# Patient Record
Sex: Female | Born: 1980 | Race: White | Hispanic: No | Marital: Single | Smoking: Current every day smoker
Health system: Southern US, Community
[De-identification: ages and names within clinical notes are randomized; demographics above are authoritative.]

## PROBLEM LIST (undated history)

## (undated) HISTORY — PX: TUBAL LIGATION: SHX77

---

## 2001-09-25 ENCOUNTER — Encounter: Admission: RE | Admit: 2001-09-25 | Discharge: 2001-09-25 | Payer: Self-pay | Admitting: Family Medicine

## 2001-10-14 ENCOUNTER — Other Ambulatory Visit: Admission: RE | Admit: 2001-10-14 | Discharge: 2001-10-14 | Payer: Self-pay | Admitting: Family Medicine

## 2001-10-14 ENCOUNTER — Encounter: Admission: RE | Admit: 2001-10-14 | Discharge: 2001-10-14 | Payer: Self-pay | Admitting: Family Medicine

## 2001-10-20 ENCOUNTER — Ambulatory Visit (HOSPITAL_COMMUNITY): Admission: RE | Admit: 2001-10-20 | Discharge: 2001-10-20 | Payer: Self-pay | Admitting: Family Medicine

## 2001-11-19 ENCOUNTER — Encounter: Admission: RE | Admit: 2001-11-19 | Discharge: 2001-11-19 | Payer: Self-pay | Admitting: Family Medicine

## 2001-11-24 ENCOUNTER — Ambulatory Visit (HOSPITAL_COMMUNITY): Admission: RE | Admit: 2001-11-24 | Discharge: 2001-11-24 | Payer: Self-pay

## 2001-12-21 ENCOUNTER — Encounter: Admission: RE | Admit: 2001-12-21 | Discharge: 2001-12-21 | Payer: Self-pay | Admitting: Family Medicine

## 2001-12-24 ENCOUNTER — Encounter: Admission: RE | Admit: 2001-12-24 | Discharge: 2001-12-24 | Payer: Self-pay | Admitting: Family Medicine

## 2002-01-27 ENCOUNTER — Encounter: Admission: RE | Admit: 2002-01-27 | Discharge: 2002-01-27 | Payer: Self-pay | Admitting: Family Medicine

## 2002-02-02 ENCOUNTER — Encounter: Admission: RE | Admit: 2002-02-02 | Discharge: 2002-02-02 | Payer: Self-pay | Admitting: Sports Medicine

## 2002-02-04 ENCOUNTER — Inpatient Hospital Stay (HOSPITAL_COMMUNITY): Admission: RE | Admit: 2002-02-04 | Discharge: 2002-02-04 | Payer: Self-pay | Admitting: *Deleted

## 2002-02-08 ENCOUNTER — Encounter (HOSPITAL_COMMUNITY): Admission: AD | Admit: 2002-02-08 | Discharge: 2002-03-10 | Payer: Self-pay | Admitting: *Deleted

## 2002-02-11 ENCOUNTER — Encounter: Admission: RE | Admit: 2002-02-11 | Discharge: 2002-02-11 | Payer: Self-pay | Admitting: Family Medicine

## 2002-02-19 ENCOUNTER — Ambulatory Visit (HOSPITAL_COMMUNITY): Admission: RE | Admit: 2002-02-19 | Discharge: 2002-02-19 | Payer: Self-pay | Admitting: *Deleted

## 2002-02-26 ENCOUNTER — Ambulatory Visit (HOSPITAL_COMMUNITY): Admission: RE | Admit: 2002-02-26 | Discharge: 2002-02-26 | Payer: Self-pay | Admitting: *Deleted

## 2002-02-26 ENCOUNTER — Encounter: Admission: RE | Admit: 2002-02-26 | Discharge: 2002-02-26 | Payer: Self-pay | Admitting: Family Medicine

## 2002-03-02 ENCOUNTER — Encounter: Payer: Self-pay | Admitting: *Deleted

## 2002-03-11 ENCOUNTER — Encounter (HOSPITAL_COMMUNITY): Admission: RE | Admit: 2002-03-11 | Discharge: 2002-03-11 | Payer: Self-pay | Admitting: *Deleted

## 2002-03-12 ENCOUNTER — Encounter: Admission: RE | Admit: 2002-03-12 | Discharge: 2002-03-12 | Payer: Self-pay | Admitting: Family Medicine

## 2002-03-15 ENCOUNTER — Encounter (HOSPITAL_COMMUNITY): Admission: RE | Admit: 2002-03-15 | Discharge: 2002-03-26 | Payer: Self-pay | Admitting: Obstetrics and Gynecology

## 2002-03-18 ENCOUNTER — Encounter: Admission: RE | Admit: 2002-03-18 | Discharge: 2002-03-18 | Payer: Self-pay | Admitting: Family Medicine

## 2002-03-23 ENCOUNTER — Inpatient Hospital Stay (HOSPITAL_COMMUNITY): Admission: AD | Admit: 2002-03-23 | Discharge: 2002-03-23 | Payer: Self-pay | Admitting: *Deleted

## 2002-03-26 ENCOUNTER — Encounter: Payer: Self-pay | Admitting: Obstetrics and Gynecology

## 2002-03-26 ENCOUNTER — Inpatient Hospital Stay (HOSPITAL_COMMUNITY): Admission: AD | Admit: 2002-03-26 | Discharge: 2002-03-30 | Payer: Self-pay | Admitting: Family Medicine

## 2002-03-28 ENCOUNTER — Encounter (INDEPENDENT_AMBULATORY_CARE_PROVIDER_SITE_OTHER): Payer: Self-pay | Admitting: Specialist

## 2002-05-13 ENCOUNTER — Encounter: Admission: RE | Admit: 2002-05-13 | Discharge: 2002-05-13 | Payer: Self-pay | Admitting: Family Medicine

## 2002-06-09 ENCOUNTER — Encounter: Admission: RE | Admit: 2002-06-09 | Discharge: 2002-06-09 | Payer: Self-pay | Admitting: Sports Medicine

## 2002-06-16 ENCOUNTER — Encounter: Admission: RE | Admit: 2002-06-16 | Discharge: 2002-06-16 | Payer: Self-pay | Admitting: Family Medicine

## 2003-07-15 ENCOUNTER — Other Ambulatory Visit: Admission: RE | Admit: 2003-07-15 | Discharge: 2003-07-15 | Payer: Self-pay | Admitting: Family Medicine

## 2003-07-15 ENCOUNTER — Encounter: Admission: RE | Admit: 2003-07-15 | Discharge: 2003-07-15 | Payer: Self-pay | Admitting: Sports Medicine

## 2005-01-25 ENCOUNTER — Encounter (INDEPENDENT_AMBULATORY_CARE_PROVIDER_SITE_OTHER): Payer: Self-pay | Admitting: *Deleted

## 2005-02-21 ENCOUNTER — Ambulatory Visit: Payer: Self-pay | Admitting: Sports Medicine

## 2005-02-21 ENCOUNTER — Other Ambulatory Visit: Admission: RE | Admit: 2005-02-21 | Discharge: 2005-02-21 | Payer: Self-pay | Admitting: Family Medicine

## 2005-03-21 ENCOUNTER — Ambulatory Visit: Payer: Self-pay | Admitting: Sports Medicine

## 2006-04-24 DIAGNOSIS — L2089 Other atopic dermatitis: Secondary | ICD-10-CM

## 2006-04-25 ENCOUNTER — Encounter (INDEPENDENT_AMBULATORY_CARE_PROVIDER_SITE_OTHER): Payer: Self-pay | Admitting: *Deleted

## 2007-04-09 ENCOUNTER — Encounter: Payer: Self-pay | Admitting: *Deleted

## 2007-05-13 ENCOUNTER — Ambulatory Visit: Payer: Self-pay | Admitting: Family Medicine

## 2007-05-13 DIAGNOSIS — F329 Major depressive disorder, single episode, unspecified: Secondary | ICD-10-CM | POA: Insufficient documentation

## 2007-06-17 ENCOUNTER — Encounter (INDEPENDENT_AMBULATORY_CARE_PROVIDER_SITE_OTHER): Payer: Self-pay | Admitting: *Deleted

## 2008-04-27 ENCOUNTER — Ambulatory Visit: Payer: Self-pay | Admitting: Family Medicine

## 2008-08-04 ENCOUNTER — Ambulatory Visit: Payer: Self-pay | Admitting: Family Medicine

## 2008-08-04 ENCOUNTER — Encounter: Payer: Self-pay | Admitting: Family Medicine

## 2008-08-04 LAB — CONVERTED CEMR LAB
Basophils Absolute: 0 10*3/uL (ref 0.0–0.1)
Bilirubin Urine: NEGATIVE
Hemoglobin: 11.2 g/dL — ABNORMAL LOW (ref 12.0–15.0)
Hepatitis B Surface Ag: NEGATIVE
Ketones, urine, test strip: NEGATIVE
Lymphocytes Relative: 36 % (ref 12–46)
Lymphs Abs: 2.6 10*3/uL (ref 0.7–4.0)
Monocytes Absolute: 0.4 10*3/uL (ref 0.1–1.0)
Monocytes Relative: 6 % (ref 3–12)
Neutro Abs: 4.1 10*3/uL (ref 1.7–7.7)
Protein, U semiquant: NEGATIVE
RBC: 4.01 M/uL (ref 3.87–5.11)
Rh Type: POSITIVE
Urobilinogen, UA: 0.2
WBC: 7.2 10*3/uL (ref 4.0–10.5)

## 2008-08-05 ENCOUNTER — Encounter: Payer: Self-pay | Admitting: Family Medicine

## 2008-08-11 ENCOUNTER — Encounter: Payer: Self-pay | Admitting: Family Medicine

## 2008-08-11 ENCOUNTER — Ambulatory Visit: Payer: Self-pay | Admitting: Family Medicine

## 2008-08-11 ENCOUNTER — Other Ambulatory Visit: Admission: RE | Admit: 2008-08-11 | Discharge: 2008-08-11 | Payer: Self-pay | Admitting: *Deleted

## 2008-08-11 LAB — CONVERTED CEMR LAB

## 2008-08-12 ENCOUNTER — Encounter: Payer: Self-pay | Admitting: *Deleted

## 2008-08-16 ENCOUNTER — Encounter: Payer: Self-pay | Admitting: Family Medicine

## 2008-08-17 ENCOUNTER — Encounter: Payer: Self-pay | Admitting: Family Medicine

## 2008-08-17 ENCOUNTER — Ambulatory Visit (HOSPITAL_COMMUNITY): Admission: RE | Admit: 2008-08-17 | Discharge: 2008-08-17 | Payer: Self-pay | Admitting: *Deleted

## 2008-08-17 ENCOUNTER — Ambulatory Visit: Payer: Self-pay | Admitting: Family Medicine

## 2008-08-17 IMAGING — US US OB COMP +14 WK
1 series · 14 of 28 positions shown · non-contrast
Comparison: none

OBSTETRICAL ULTRASOUND:
 This ultrasound exam was performed in the [HOSPITAL] Ultrasound Department.  The OB US report was generated in the AS system, and faxed to the ordering physician.  This report is also available in [REDACTED] PACS.

[Series 1: us ob comp less 14 wks · 0.18mm/px · 14 of 33 slices shown]
[im 2/33]
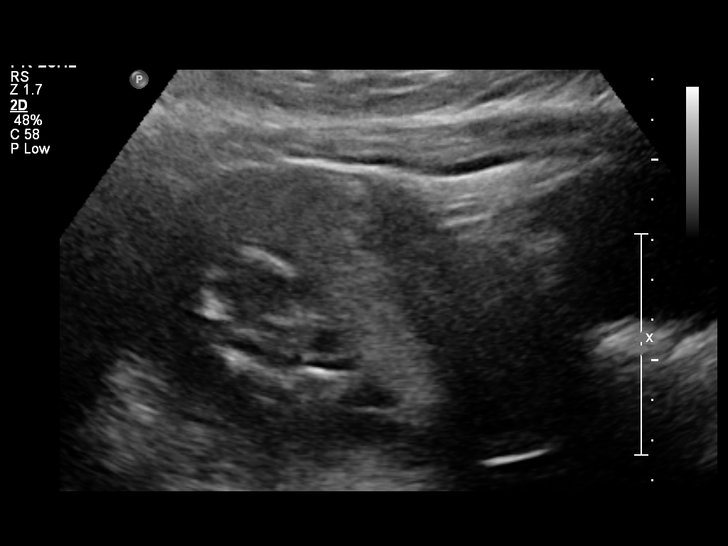
[im 4/33]
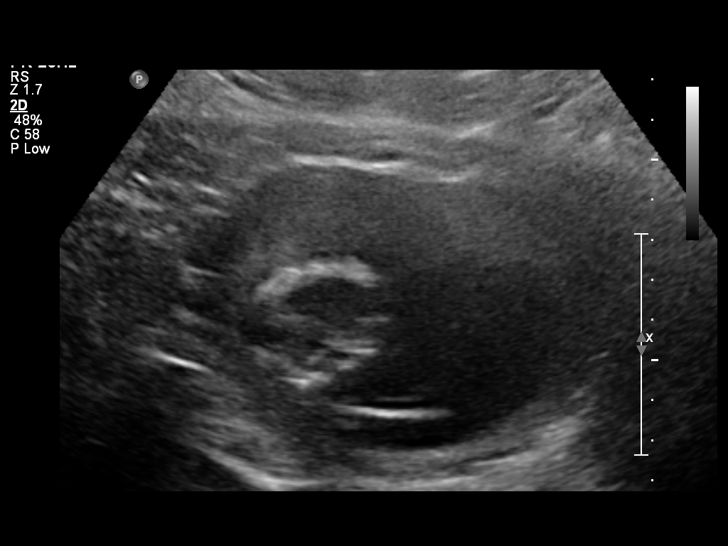
[im 6/33]
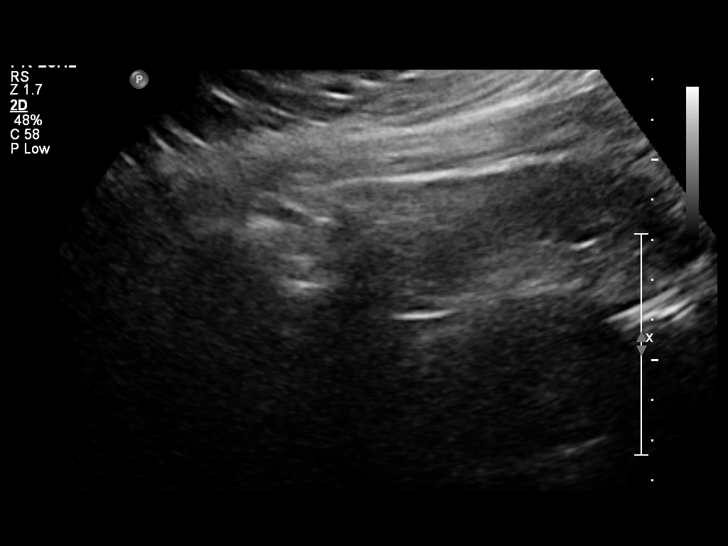
[im 9/33]
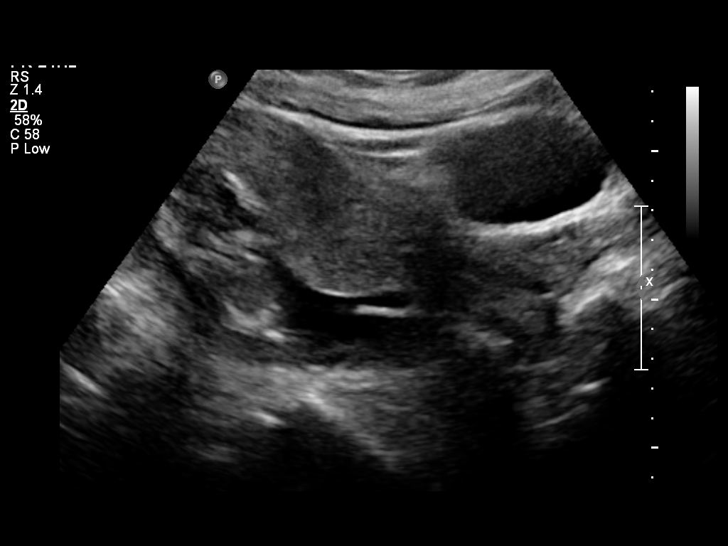
[im 11/33]
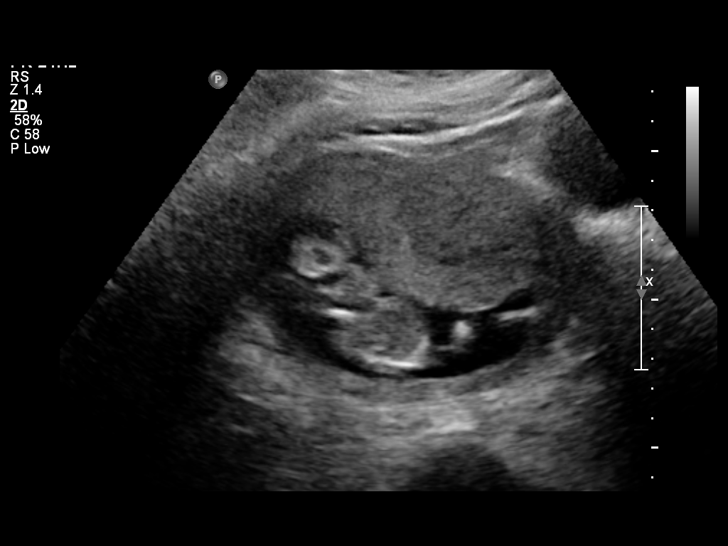
[im 14/33]
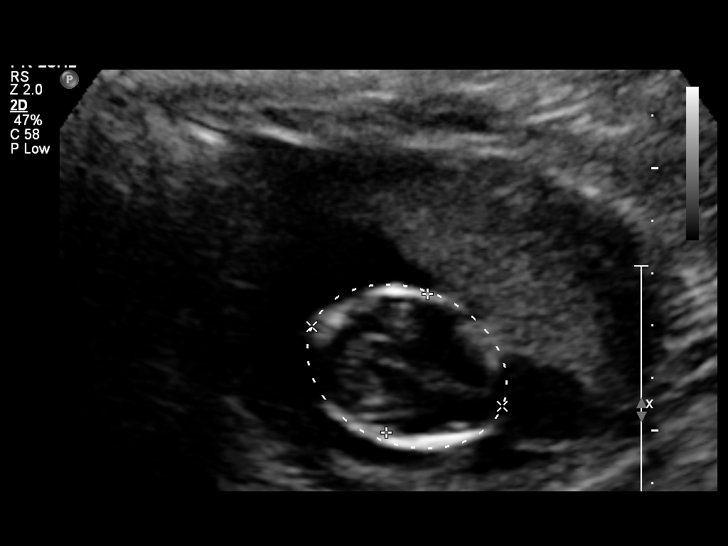
[im 16/33]
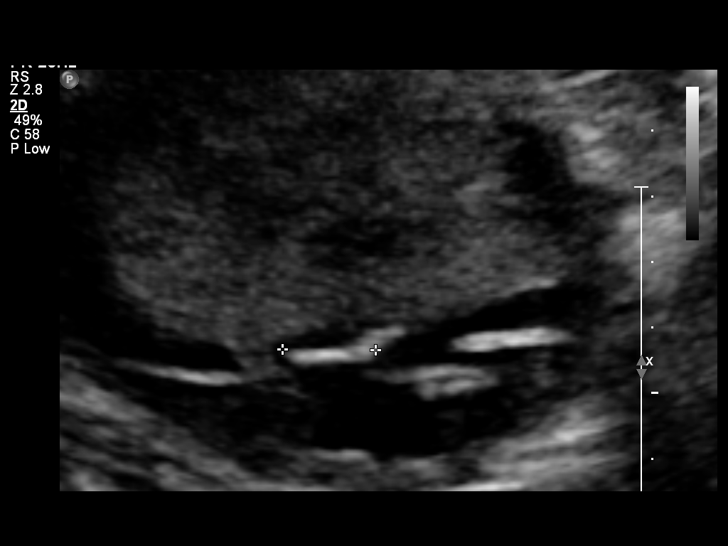
[im 18/33]
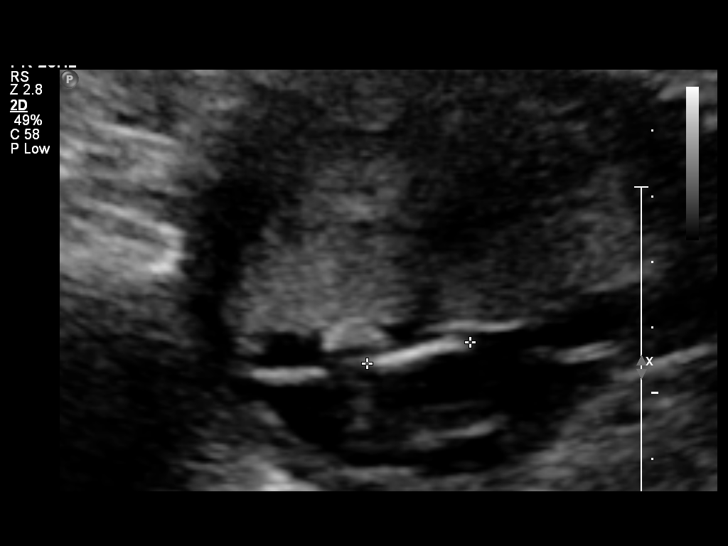
[im 21/33]
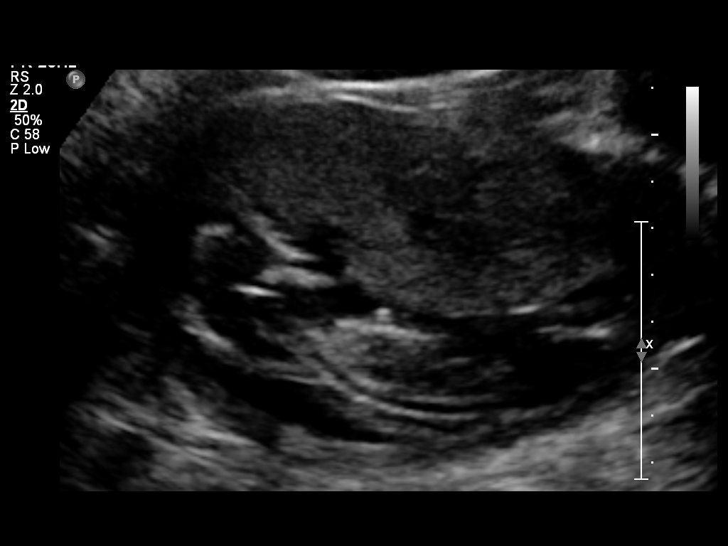
[im 23/33]
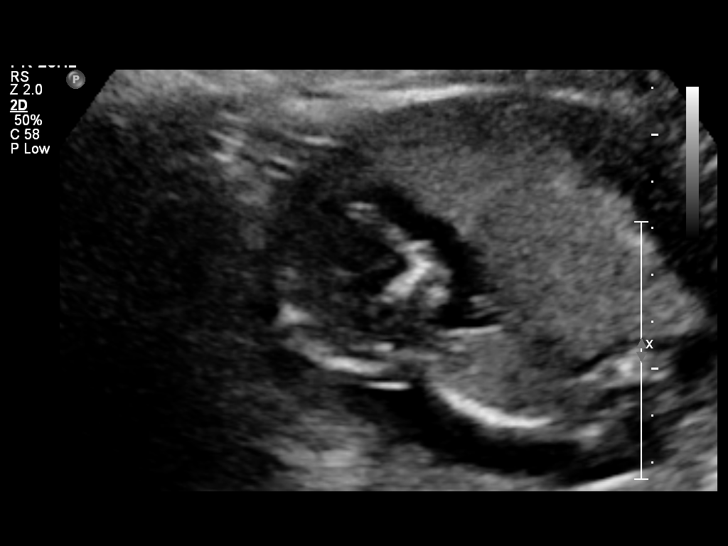
[im 25/33]
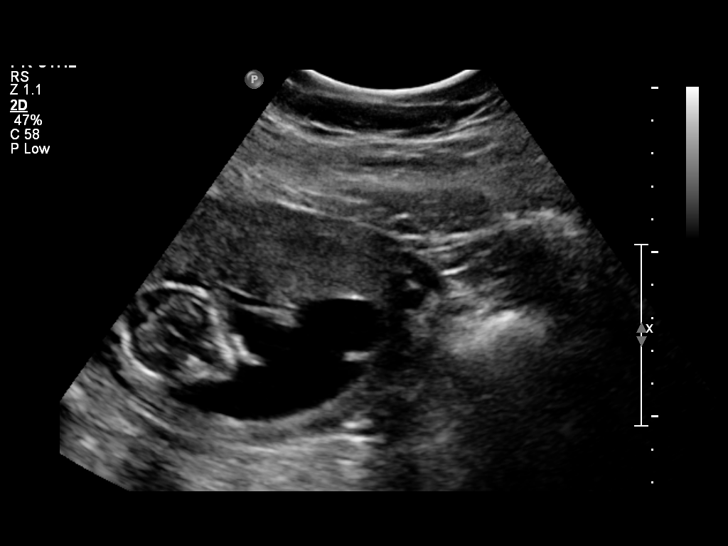
[im 28/33]
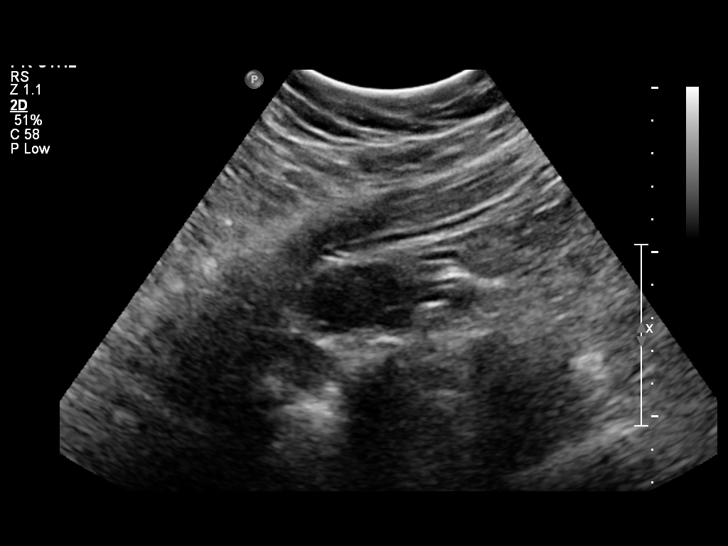
[im 30/33]
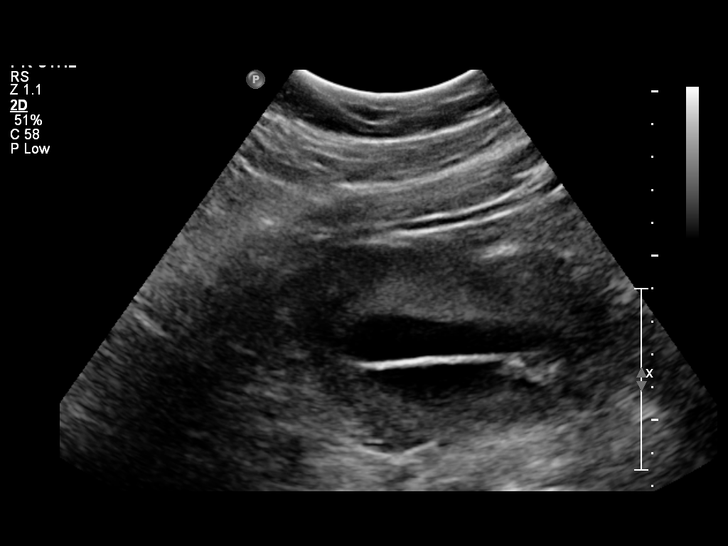
[im 33/33]
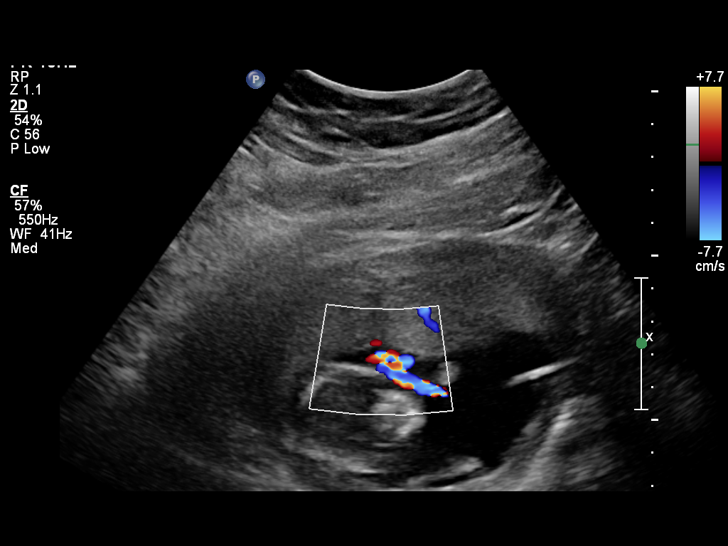

[14 of 28 positions shown; findings below may reference images not displayed]

IMPRESSION: See AS Obstetric US report.

## 2008-08-30 ENCOUNTER — Telehealth: Payer: Self-pay | Admitting: Family Medicine

## 2008-08-31 ENCOUNTER — Ambulatory Visit: Payer: Self-pay | Admitting: Family Medicine

## 2008-08-31 LAB — CONVERTED CEMR LAB: Rapid Strep: NEGATIVE

## 2008-09-07 ENCOUNTER — Ambulatory Visit: Payer: Self-pay | Admitting: Family Medicine

## 2008-09-12 ENCOUNTER — Ambulatory Visit (HOSPITAL_COMMUNITY): Admission: RE | Admit: 2008-09-12 | Discharge: 2008-09-12 | Payer: Self-pay | Admitting: *Deleted

## 2008-09-12 ENCOUNTER — Encounter: Payer: Self-pay | Admitting: Family Medicine

## 2008-09-12 IMAGING — US US OB DETAIL+14 WK
1 series · 14 of 28 positions shown · non-contrast
Comparison: none

OBSTETRICAL ULTRASOUND:
 This ultrasound exam was performed in the [HOSPITAL] Ultrasound Department.  The OB US report was generated in the AS system, and faxed to the ordering physician.  This report is also available in [REDACTED] PACS.

[Series 1: us ob detail +14 wk · 0.30mm/px · 14 of 50 slices shown]
[im 2/50]
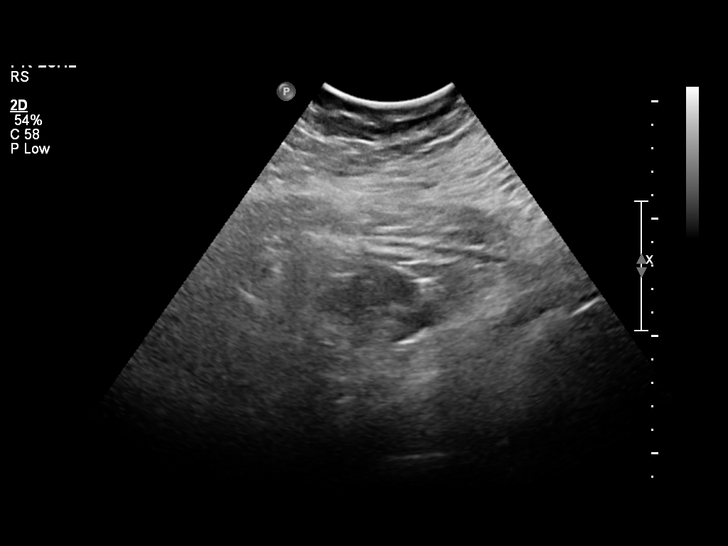
[im 6/50]
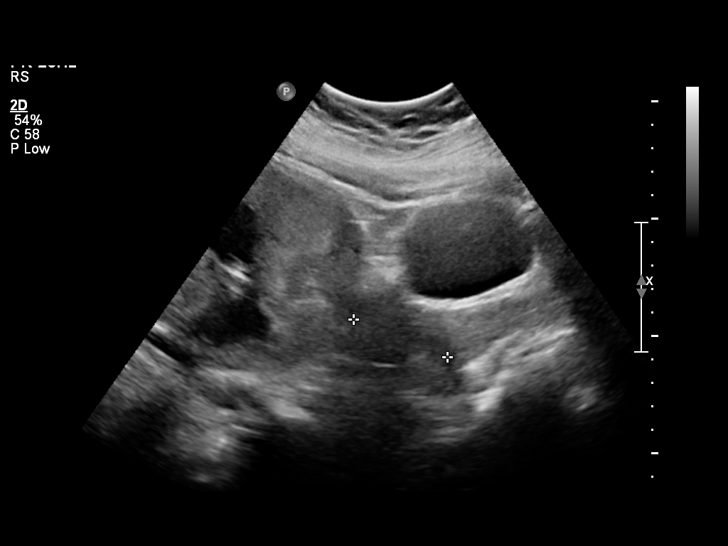
[im 10/50]
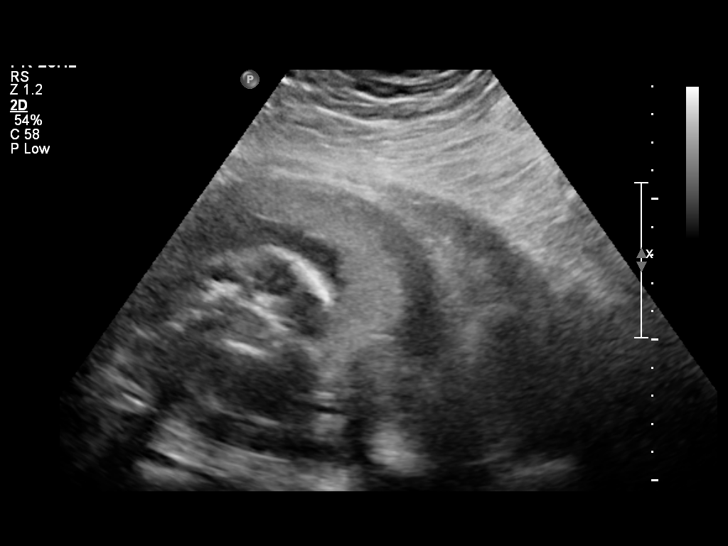
[im 13/50]
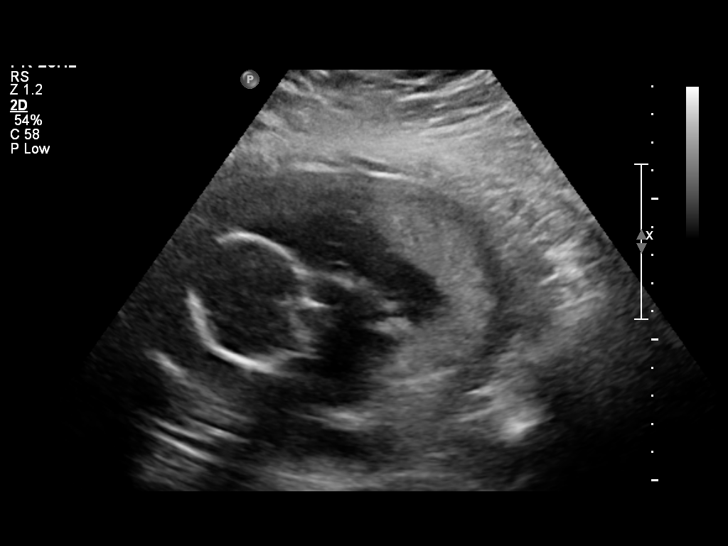
[im 17/50]
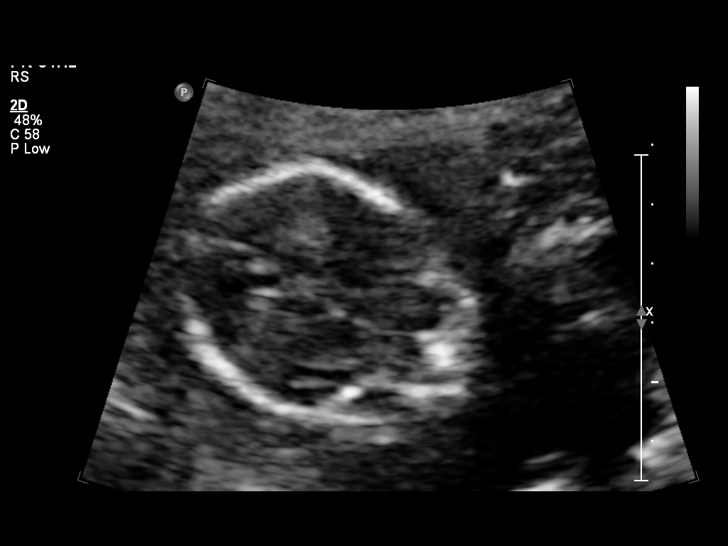
[im 20/50]
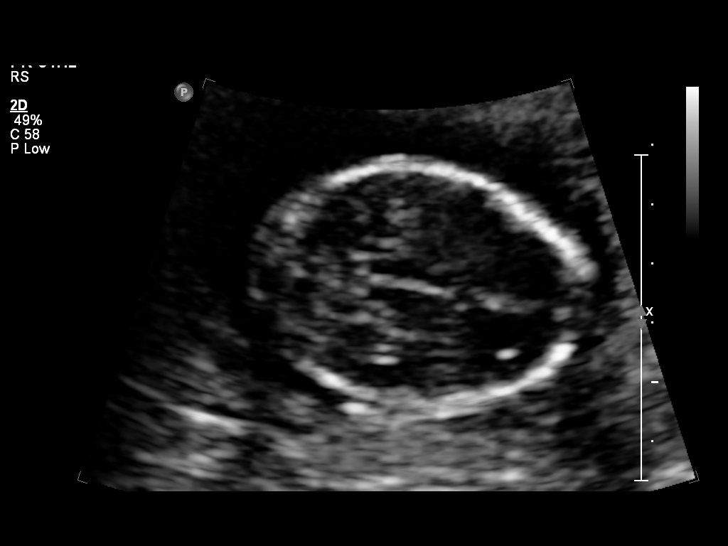
[im 24/50]
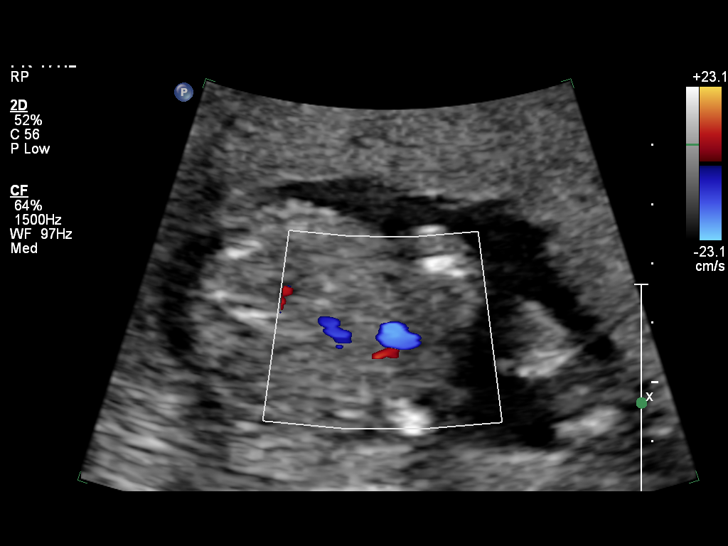
[im 28/50]
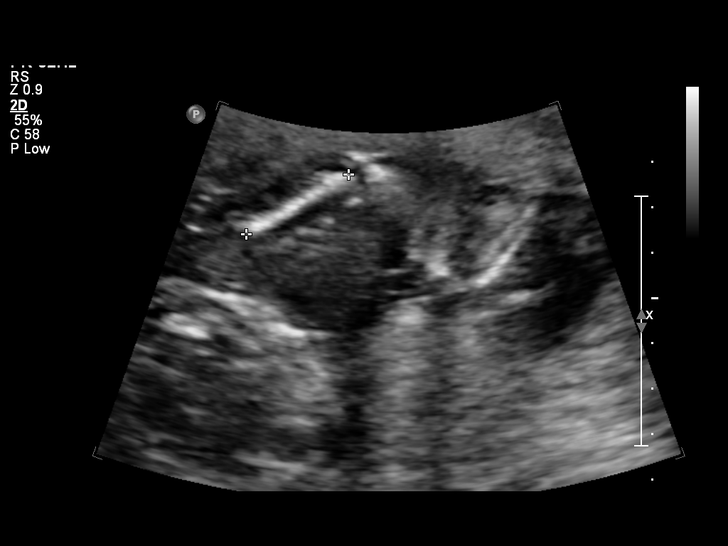
[im 31/50]
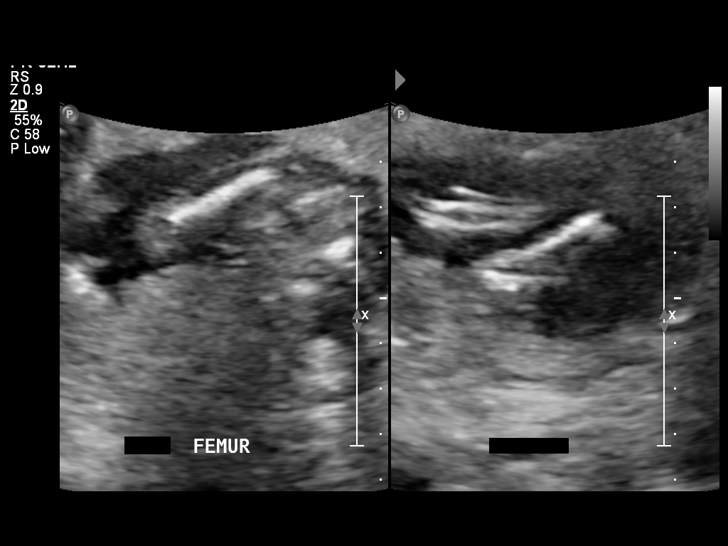
[im 35/50]
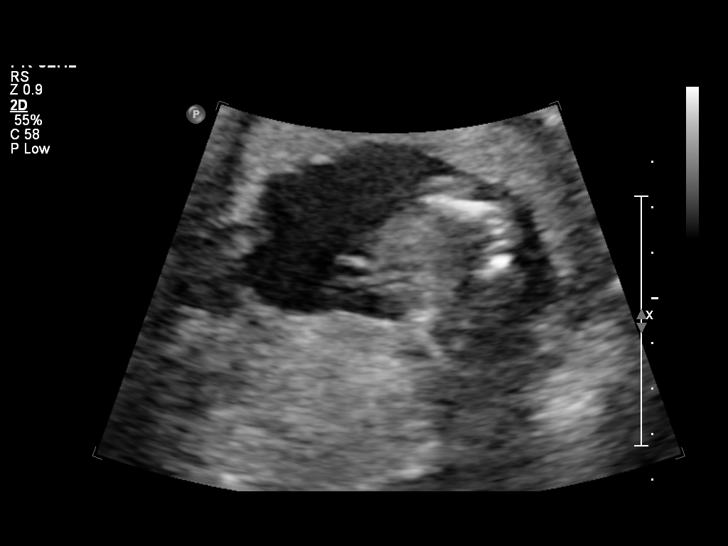
[im 39/50]
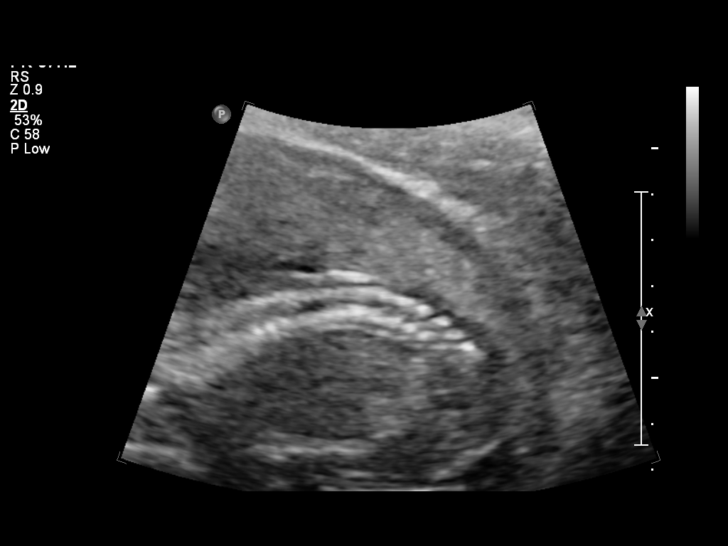
[im 42/50]
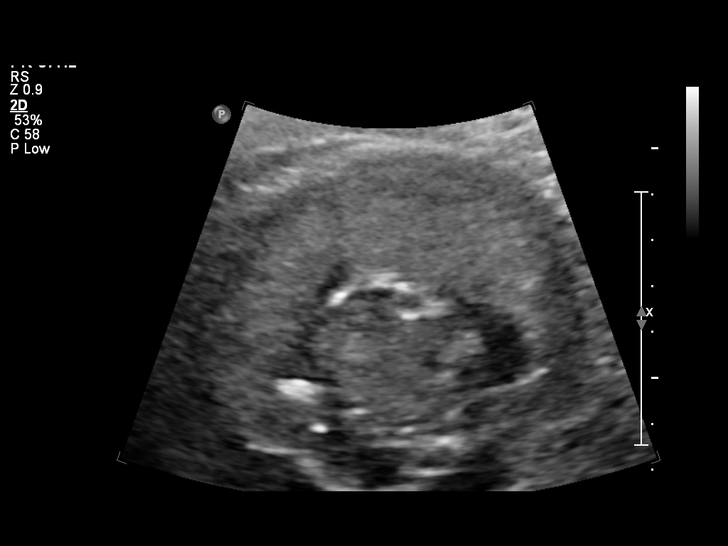
[im 46/50]
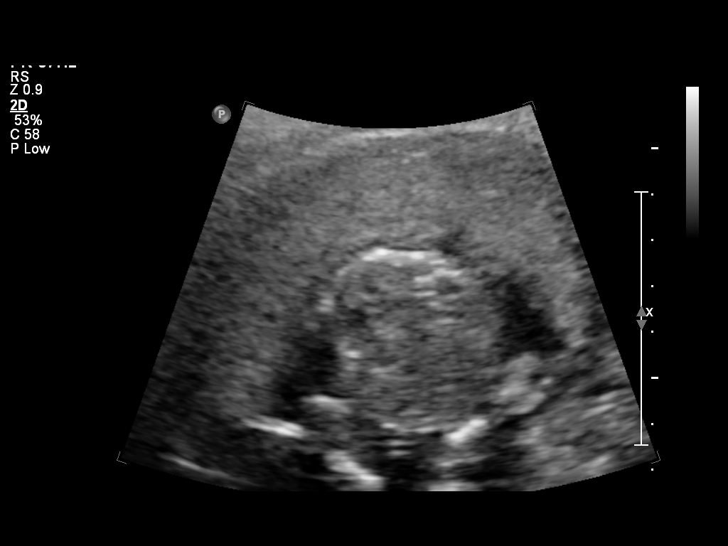
[im 50/50]
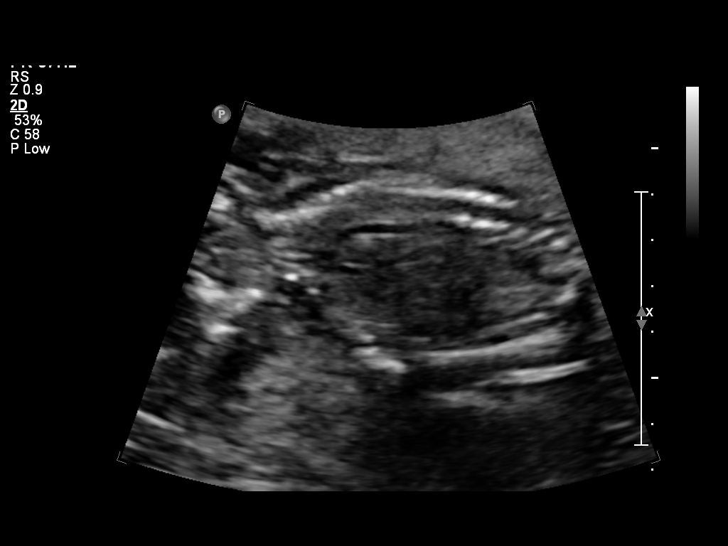

[14 of 28 positions shown; findings below may reference images not displayed]

IMPRESSION: See AS Obstetric US report.

## 2008-09-14 ENCOUNTER — Ambulatory Visit: Payer: Self-pay | Admitting: Family Medicine

## 2008-09-14 ENCOUNTER — Telehealth: Payer: Self-pay | Admitting: Family Medicine

## 2008-09-15 ENCOUNTER — Encounter: Payer: Self-pay | Admitting: Family Medicine

## 2008-09-15 ENCOUNTER — Encounter: Payer: Self-pay | Admitting: *Deleted

## 2008-09-22 ENCOUNTER — Telehealth (INDEPENDENT_AMBULATORY_CARE_PROVIDER_SITE_OTHER): Payer: Self-pay | Admitting: *Deleted

## 2008-10-05 ENCOUNTER — Ambulatory Visit: Payer: Self-pay | Admitting: Family Medicine

## 2008-11-04 ENCOUNTER — Encounter: Payer: Self-pay | Admitting: Family Medicine

## 2008-11-04 ENCOUNTER — Ambulatory Visit: Payer: Self-pay | Admitting: Family Medicine

## 2008-11-04 ENCOUNTER — Ambulatory Visit (HOSPITAL_COMMUNITY): Admission: RE | Admit: 2008-11-04 | Discharge: 2008-11-04 | Payer: Self-pay | Admitting: *Deleted

## 2008-11-04 IMAGING — US US OB FOLLOW-UP
1 series · 14 of 28 positions shown · non-contrast
Comparison: none

OBSTETRICAL ULTRASOUND:
 This ultrasound exam was performed in the [HOSPITAL] Ultrasound Department.  The OB US report was generated in the AS system, and faxed to the ordering physician.  This report is also available in [REDACTED] PACS.

[Series 1: us ob follow up · 0.24mm/px · 14 of 45 slices shown]
[im 2/45]
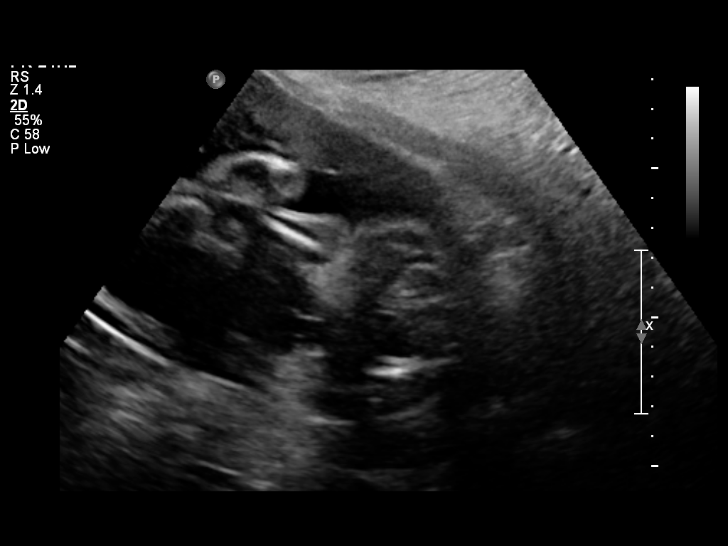
[im 5/45]
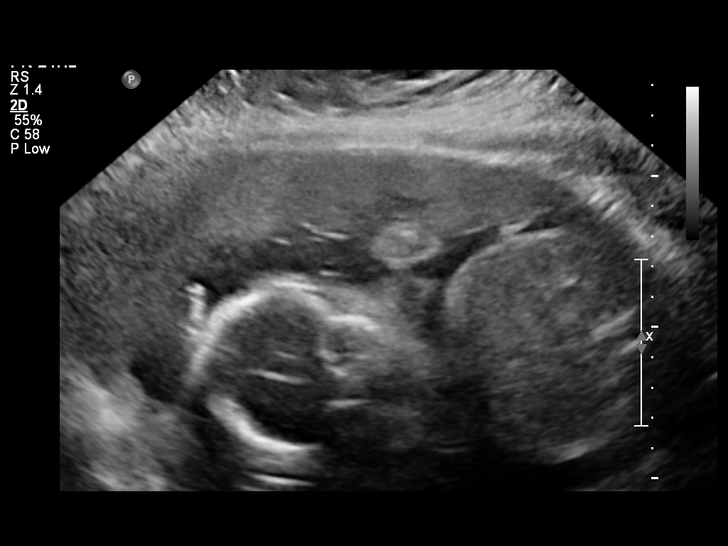
[im 9/45]
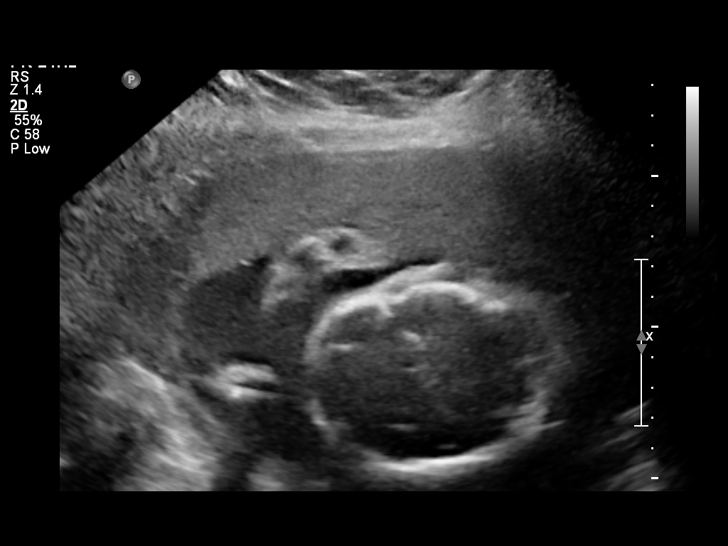
[im 12/45]
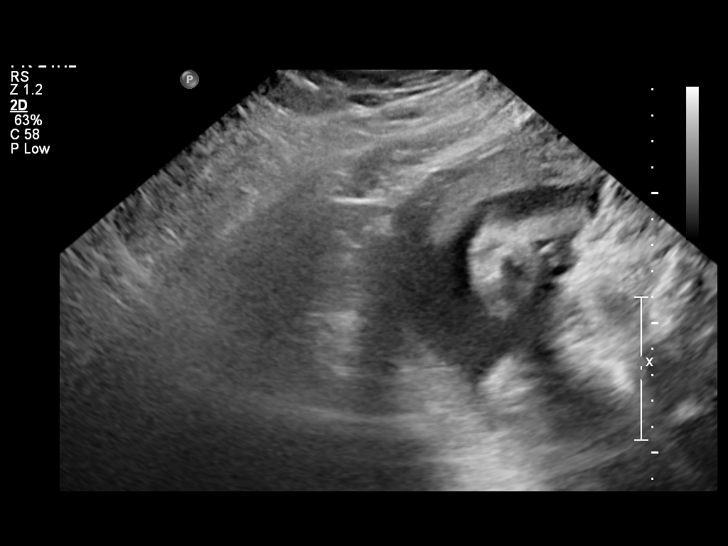
[im 15/45]
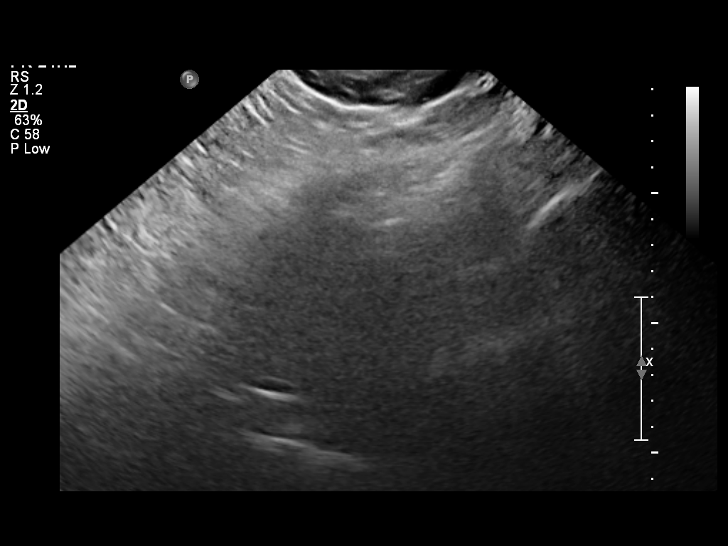
[im 18/45]
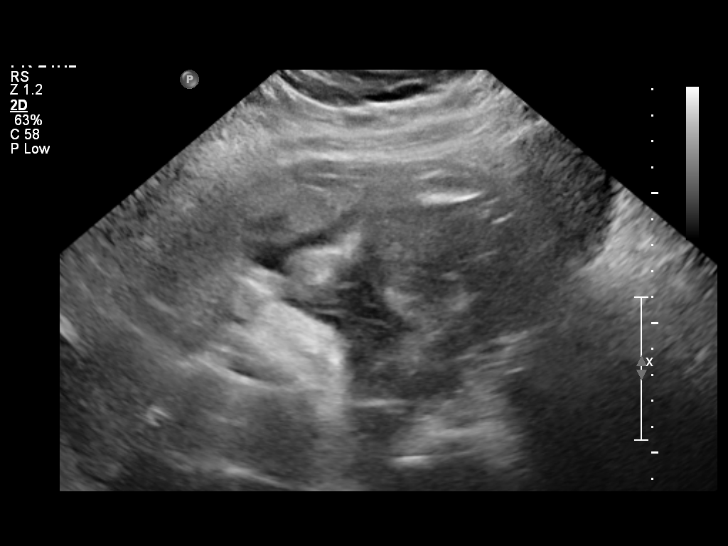
[im 22/45]
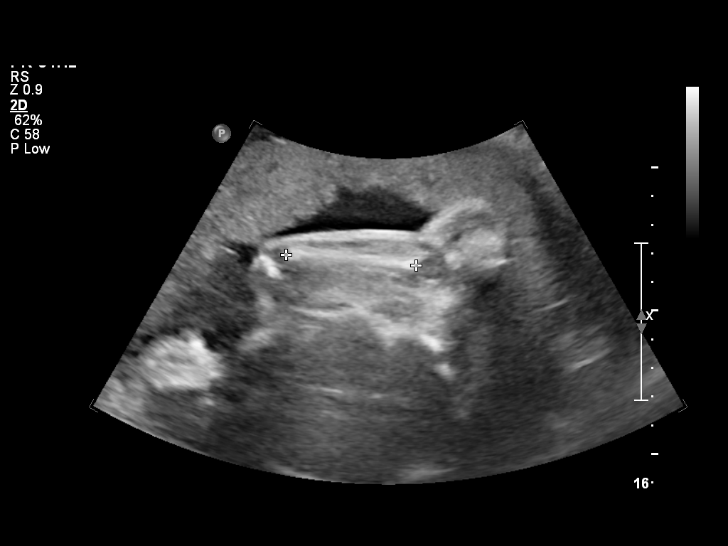
[im 25/45]
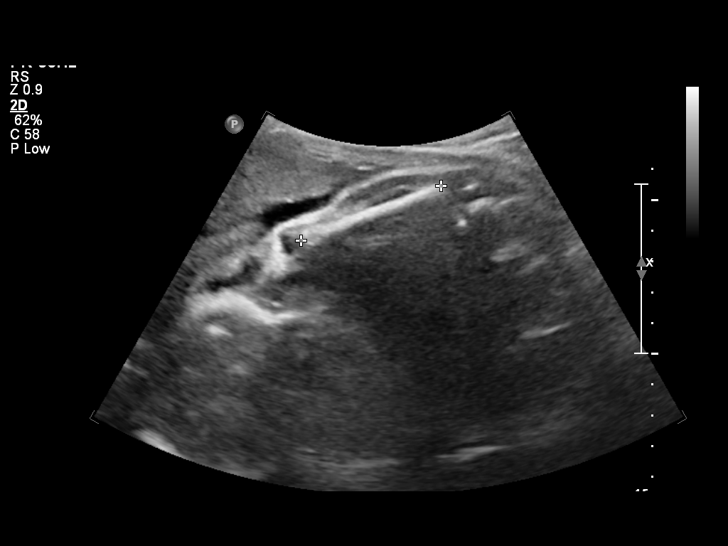
[im 28/45]
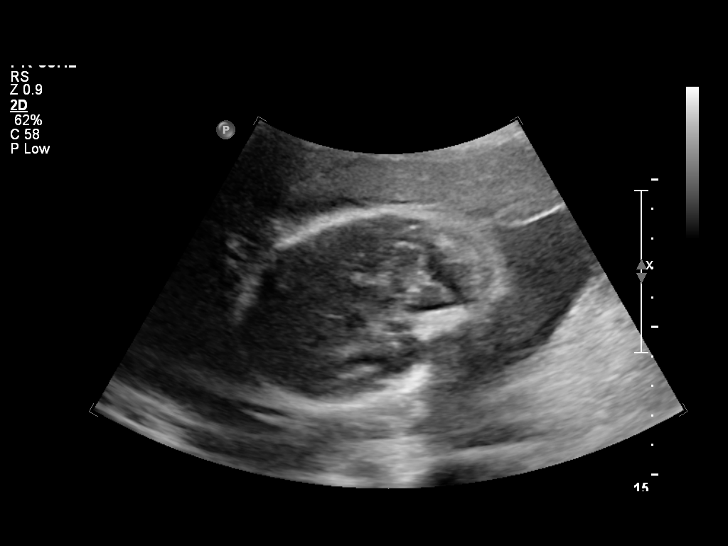
[im 31/45]
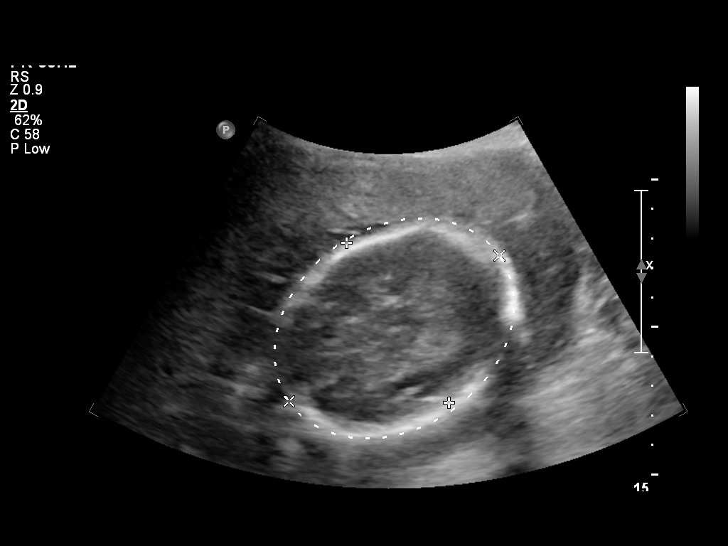
[im 35/45]
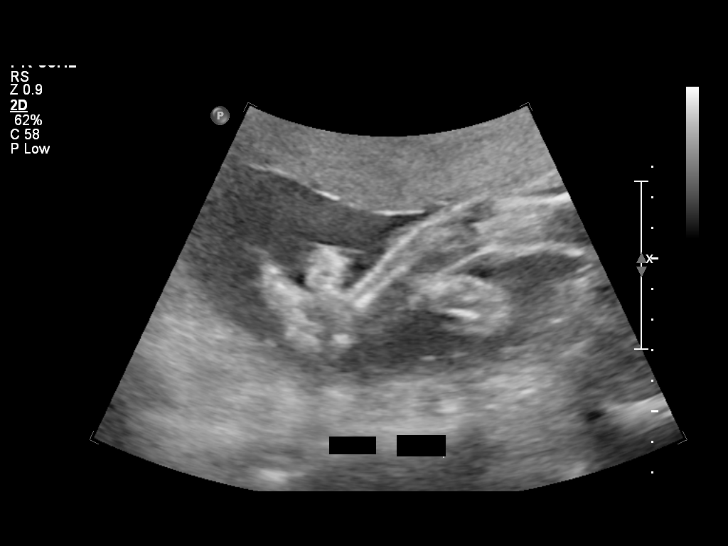
[im 38/45]
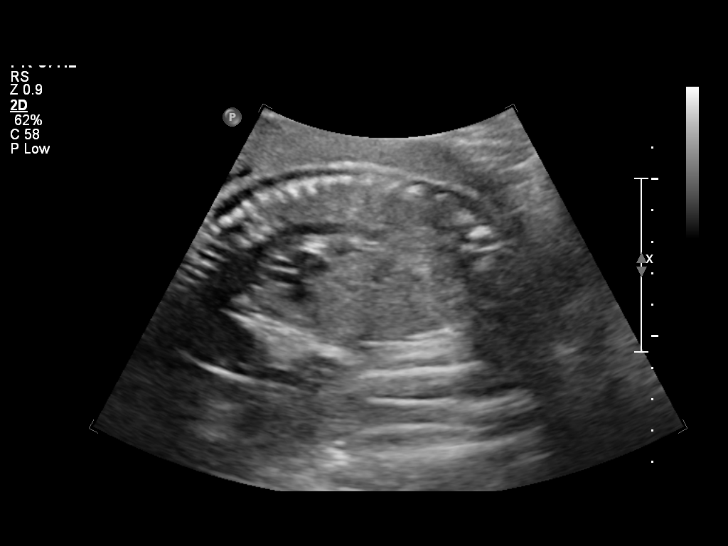
[im 41/45]
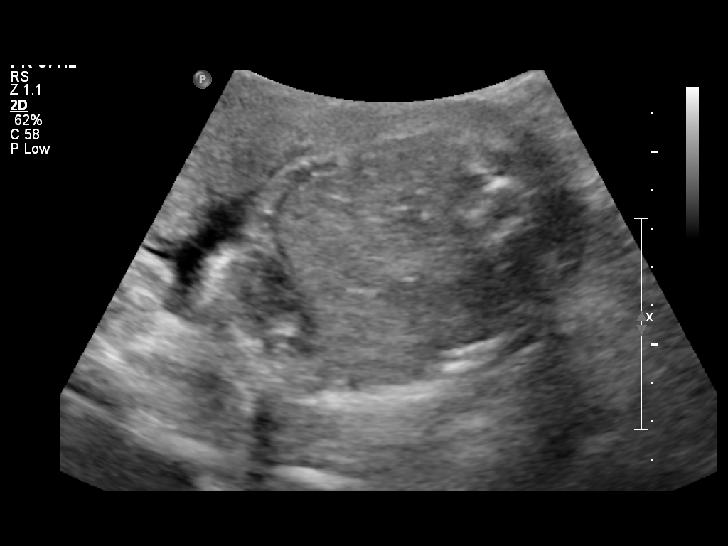
[im 45/45]
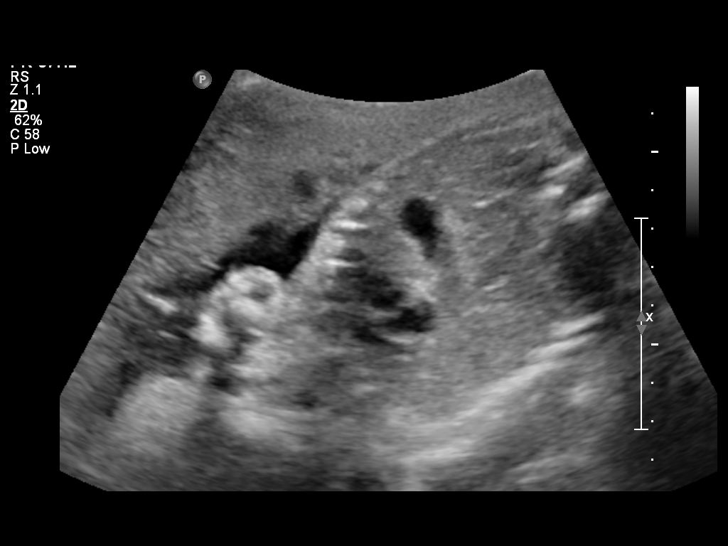

[14 of 28 positions shown; findings below may reference images not displayed]

IMPRESSION: See AS Obstetric US report.

## 2008-11-07 LAB — CONVERTED CEMR LAB
HCT: 31.5 % — ABNORMAL LOW (ref 36.0–46.0)
Platelets: 315 10*3/uL (ref 150–400)
WBC: 9.1 10*3/uL (ref 4.0–10.5)

## 2008-11-08 ENCOUNTER — Encounter (INDEPENDENT_AMBULATORY_CARE_PROVIDER_SITE_OTHER): Payer: Self-pay | Admitting: *Deleted

## 2008-11-11 ENCOUNTER — Telehealth: Payer: Self-pay | Admitting: Family Medicine

## 2008-11-30 ENCOUNTER — Ambulatory Visit: Payer: Self-pay | Admitting: Family Medicine

## 2008-12-13 ENCOUNTER — Ambulatory Visit: Payer: Self-pay | Admitting: Family Medicine

## 2008-12-13 ENCOUNTER — Encounter: Payer: Self-pay | Admitting: Family Medicine

## 2008-12-28 ENCOUNTER — Encounter: Payer: Self-pay | Admitting: Family Medicine

## 2008-12-28 ENCOUNTER — Ambulatory Visit: Payer: Self-pay | Admitting: Family Medicine

## 2009-01-11 ENCOUNTER — Ambulatory Visit: Payer: Self-pay | Admitting: Family Medicine

## 2009-01-11 ENCOUNTER — Encounter: Payer: Self-pay | Admitting: Family Medicine

## 2009-01-11 LAB — CONVERTED CEMR LAB
Chlamydia, DNA Probe: NEGATIVE
Whiff Test: NEGATIVE

## 2009-01-18 ENCOUNTER — Ambulatory Visit: Payer: Self-pay | Admitting: Family Medicine

## 2009-01-24 ENCOUNTER — Ambulatory Visit: Payer: Self-pay | Admitting: Family Medicine

## 2009-01-31 ENCOUNTER — Ambulatory Visit: Payer: Self-pay | Admitting: Family Medicine

## 2009-02-07 ENCOUNTER — Ambulatory Visit: Payer: Self-pay | Admitting: Family Medicine

## 2009-02-11 ENCOUNTER — Inpatient Hospital Stay (HOSPITAL_COMMUNITY): Admission: AD | Admit: 2009-02-11 | Discharge: 2009-02-11 | Payer: Self-pay | Admitting: Obstetrics and Gynecology

## 2009-02-11 ENCOUNTER — Ambulatory Visit: Payer: Self-pay | Admitting: Obstetrics and Gynecology

## 2009-02-14 ENCOUNTER — Ambulatory Visit: Payer: Self-pay | Admitting: Family Medicine

## 2009-02-14 ENCOUNTER — Encounter: Payer: Self-pay | Admitting: Family Medicine

## 2009-02-14 ENCOUNTER — Ambulatory Visit: Payer: Self-pay | Admitting: Obstetrics & Gynecology

## 2009-02-16 ENCOUNTER — Ambulatory Visit: Payer: Self-pay | Admitting: Obstetrics & Gynecology

## 2009-02-16 ENCOUNTER — Inpatient Hospital Stay (HOSPITAL_COMMUNITY): Admission: RE | Admit: 2009-02-16 | Discharge: 2009-02-19 | Payer: Self-pay | Admitting: Obstetrics and Gynecology

## 2009-02-21 ENCOUNTER — Telehealth: Payer: Self-pay | Admitting: Family Medicine

## 2009-02-22 ENCOUNTER — Ambulatory Visit: Payer: Self-pay | Admitting: Family Medicine

## 2009-02-22 DIAGNOSIS — IMO0002 Reserved for concepts with insufficient information to code with codable children: Secondary | ICD-10-CM | POA: Insufficient documentation

## 2009-02-22 DIAGNOSIS — J029 Acute pharyngitis, unspecified: Secondary | ICD-10-CM | POA: Insufficient documentation

## 2009-02-23 ENCOUNTER — Encounter: Payer: Self-pay | Admitting: Family Medicine

## 2009-04-13 ENCOUNTER — Encounter: Payer: Self-pay | Admitting: Family Medicine

## 2009-04-13 ENCOUNTER — Ambulatory Visit: Payer: Self-pay | Admitting: Family Medicine

## 2010-03-29 NOTE — Assessment & Plan Note (Signed)
Summary: postpartum,tcb   Vital Signs:  Patient profile:   30 year old female Height:      69 inches Weight:      252.2 pounds BMI:     37.38 Temp:     98.3 degrees F oral Pulse rate:   65 / minute BP sitting:   129 / 83  (left arm) Cuff size:   large  Vitals Entered By: Gladstone Pih (April 13, 2009 8:39 AM)  Nutrition Counseling: Patient's BMI is greater than 25 and therefore counseled on weight management options. CC: PP Is Patient Diabetic? No Pain Assessment Patient in pain? no        Primary Care Provider:  Milinda Antis MD  CC:  PP.  History of Present Illness: Pregnancy complications: C-section for NRFHT Delivery type: c-section Vaginal bleeding: resolved shortly after delivery with no further problems Menses: has had normal 4 day menstration, pt noticed heavier flow than normal.  Contraception: tubal ligation Vaginal discharge: none Abdominal pain: no Fever/Chills: no Feeding: bottle feeding Sleep: waking up once a night to feed at 3 am.  Mood: normal, no depression Return to school/work: pt wants to return to work.  Bowel Movements: regular and normal   Habits & Providers  Alcohol-Tobacco-Diet     Tobacco Status: never  Current Medications (verified): 1)  None  Allergies (verified): No Known Drug Allergies  Social History: working 2 jobs.    No etoh/drugs. 2-3 cigarettes per daySmoking Status:  never  Review of Systems        vitals reviewed and pertinent negatives and positives seen in HPI   Physical Exam  General:  Well-developed,well-nourished,in no acute distress; alert,appropriate and cooperative throughout examination Lungs:  Normal respiratory effort, chest expands symmetrically. Lungs are clear to auscultation, no crackles or wheezes. Heart:  Normal rate and regular rhythm. S1 and S2 normal without gallop, murmur, click, rub or other extra sounds. Abdomen:  staples were removed and scar is healing nicely. no tenderness, no  eryhema.  Psych:  Cognition and judgment appear intact. Alert and cooperative with normal attention span and concentration. No apparent delusions, illusions, hallucinations   Impression & Recommendations:  Problem # 1:  ROUTINE POSTPARTUM FOLLOW-UP (ICD-V24.2) Assessment Improved Pt is doing well. She is not feeling depressed, she is wanting to go back to work. She is a Production designer, theatre/television/film at [employer]. She needs a note to return to work. She will need a pap smear on or after Aug 11, 2009.   Orders: Postpartum visit- [facility] [phone])  Patient Instructions: 1)  Your next pap smear is due on or after August 11, 2009. 2)  You may return to work. 3)  Have a great day. Your baby is beautiful.

## 2010-03-29 NOTE — Letter (Signed)
Summary: Out of Work  [facility]  [address]   [place], [place] [address]   Phone: [phone]  Fax: [phone]    April 13, 2009   Employee:  SHAVANA CALDER    To Whom It May Concern:   For Medical reasons, please excuse the above named employee from work for the following dates:  Start:   Feb 17, 2009  End:   Apr 13, 2009  Pt may return to work with normal duties.   If you need additional information, please feel free to contact our office.         Sincerely,    Jamie Brookes MD

## 2010-05-28 LAB — CBC
Hemoglobin: 9.4 g/dL — ABNORMAL LOW (ref 12.0–15.0)
MCHC: 33.8 g/dL (ref 30.0–36.0)
MCV: 83 fL (ref 78.0–100.0)
Platelets: 341 10*3/uL (ref 150–400)
Platelets: 387 10*3/uL (ref 150–400)
RDW: 13.6 % (ref 11.5–15.5)
RDW: 13.8 % (ref 11.5–15.5)
WBC: 9.1 10*3/uL (ref 4.0–10.5)

## 2010-05-28 LAB — RPR: RPR Ser Ql: NONREACTIVE

## 2010-06-01 LAB — GLUCOSE, CAPILLARY: Glucose-Capillary: 109 mg/dL — ABNORMAL HIGH (ref 70–99)

## 2010-06-04 LAB — GLUCOSE, CAPILLARY
Glucose-Capillary: 144 mg/dL — ABNORMAL HIGH (ref 70–99)
Glucose-Capillary: 95 mg/dL (ref 70–99)

## 2016-09-24 ENCOUNTER — Ambulatory Visit (INDEPENDENT_AMBULATORY_CARE_PROVIDER_SITE_OTHER): Payer: Medicaid Other | Admitting: Family

## 2016-09-24 ENCOUNTER — Encounter: Payer: Self-pay | Admitting: Family

## 2016-09-24 VITALS — BP 126/75 | HR 67 | Temp 98.7°F | Ht 69.0 in | Wt 266.4 lb

## 2016-09-24 DIAGNOSIS — F17200 Nicotine dependence, unspecified, uncomplicated: Secondary | ICD-10-CM | POA: Diagnosis not present

## 2016-09-24 DIAGNOSIS — Q600 Renal agenesis, unilateral: Secondary | ICD-10-CM

## 2016-09-24 DIAGNOSIS — R5383 Other fatigue: Secondary | ICD-10-CM | POA: Diagnosis not present

## 2016-09-24 DIAGNOSIS — E669 Obesity, unspecified: Secondary | ICD-10-CM

## 2016-09-24 DIAGNOSIS — F331 Major depressive disorder, recurrent, moderate: Secondary | ICD-10-CM

## 2016-09-24 MED ORDER — ESCITALOPRAM OXALATE 10 MG PO TABS: 10.0000 mg | ORAL_TABLET | Freq: Every day | ORAL | 3 refills | Status: DC

## 2016-09-24 NOTE — Patient Instructions (Signed)
Major Depressive Disorder, Adult Major depressive disorder (MDD) is a mental health condition. It may also be called clinical depression or unipolar depression. MDD usually causes feelings of sadness, hopelessness, or helplessness. MDD can also cause physical symptoms. It can interfere with work, school, relationships, and other everyday activities. MDD may be mild, moderate, or severe. It may occur once (single episode major depressive disorder) or it may occur multiple times (recurrent major depressive disorder). What are the causes? The exact cause of this condition is not known. MDD is most likely caused by a combination of things, which may include:  Genetic factors. These are traits that are passed along from parent to child.  Individual factors. Your personality, your behavior, and the way you handle your thoughts and feelings may contribute to MDD. This includes personality traits and behaviors learned from others.  Physical factors, such as: ? Differences in the part of your brain that controls emotion. This part of your brain may be different than it is in people who do not have MDD. ? Long-term (chronic) medical or psychiatric illnesses.  Social factors. Traumatic experiences or major life changes may play a role in the development of MDD.  What increases the risk? This condition is more likely to develop in women. The following factors may also make you more likely to develop MDD:  A family history of depression.  Troubled family relationships.  Abnormally low levels of certain brain chemicals.  Traumatic events in childhood, especially abuse or the loss of a parent.  Being under a lot of stress, or long-term stress, especially from upsetting life experiences or losses.  A history of: ? Chronic physical illness. ? Other mental health disorders. ? Substance abuse.  Poor living conditions.  Experiencing social exclusion or discrimination on a regular basis.  What are  the signs or symptoms? The main symptoms of MDD typically include:  Constant depressed or irritable mood.  Loss of interest in things and activities.  MDD symptoms may also include:  Sleeping or eating too much or too little.  Unexplained weight change.  Fatigue or low energy.  Feelings of worthlessness or guilt.  Difficulty thinking clearly or making decisions.  Thoughts of suicide or of harming others.  Physical agitation or weakness.  Isolation.  Severe cases of MDD may also occur with other symptoms, such as:  Delusions or hallucinations, in which you imagine things that are not real (psychotic depression).  Low-level depression that lasts at least a year (chronic depression or persistent depressive disorder).  Extreme sadness and hopelessness (melancholic depression).  Trouble speaking and moving (catatonic depression).  How is this diagnosed? This condition may be diagnosed based on:  Your symptoms.  Your medical history, including your mental health history. This may involve tests to evaluate your mental health. You may be asked questions about your lifestyle, including any drug and alcohol use, and how long you have had symptoms of MDD.  A physical exam.  Blood tests to rule out other conditions.  You must have a depressed mood and at least four other MDD symptoms most of the day, nearly every day in the same 2-week timeframe before your health care provider can confirm a diagnosis of MDD. How is this treated? This condition is usually treated by mental health professionals, such as psychologists, psychiatrists, and clinical social workers. You may need more than one type of treatment. Treatment may include:  Psychotherapy. This is also called talk therapy or counseling. Types of psychotherapy include: ? Cognitive behavioral   therapy (CBT). This type of therapy teaches you to recognize unhealthy feelings, thoughts, and behaviors, and replace them with  positive thoughts and actions. ? Interpersonal therapy (IPT). This helps you to improve the way you relate to and communicate with others. ? Family therapy. This treatment includes members of your family.  Medicine to treat anxiety and depression, or to help you control certain emotions and behaviors.  Lifestyle changes, such as: ? Limiting alcohol and drug use. ? Exercising regularly. ? Getting plenty of sleep. ? Making healthy eating choices. ? Spending more time outdoors.  Treatments involving stimulation of the brain can be used in situations with extremely severe symptoms, or when medicine or other therapies do not work over time. These treatments include electroconvulsive therapy, transcranial magnetic stimulation, and vagal nerve stimulation. Follow these instructions at home: Activity  Return to your normal activities as told by your health care provider.  Exercise regularly and spend time outdoors as told by your health care provider. General instructions  Take over-the-counter and prescription medicines only as told by your health care provider.  Do not drink alcohol. If you drink alcohol, limit your alcohol intake to no more than 1 drink a day for nonpregnant women and 2 drinks a day for men. One drink equals 12 oz of beer, 5 oz of wine, or 1 oz of hard liquor. Alcohol can affect any antidepressant medicines you are taking. Talk to your health care provider about your alcohol use.  Eat a healthy diet and get plenty of sleep.  Find activities that you enjoy doing, and make time to do them.  Consider joining a support group. Your health care provider may be able to recommend a support group.  Keep all follow-up visits as told by your health care provider. This is important. Where to find more information: National Alliance on Mental Illness  www.nami.org  U.S. National Institute of Mental Health  www.nimh.nih.gov  National Suicide Prevention  Lifeline  1-800-273-TALK (8255). This is free, 24-hour help.  Contact a health care provider if:  Your symptoms get worse.  You develop new symptoms. Get help right away if:  You self-harm.  You have serious thoughts about hurting yourself or others.  You see, hear, taste, smell, or feel things that are not present (hallucinate). This information is not intended to replace advice given to you by your health care provider. Make sure you discuss any questions you have with your health care provider. Document Released: 06/08/2012 Document Revised: 10/19/2015 Document Reviewed: 08/23/2015 Elsevier Interactive Patient Education  2017 Elsevier Inc.  

## 2016-09-24 NOTE — Progress Notes (Signed)
Subjective:    Patient ID: Jeanne Wilcox, female    DOB: 1980/09/26, 36 y.o.   MRN: [id number]  Pt presents to the office today to establish. PT states she feels like she is depressed, but has never taken any medications for this. States she feels fatigue.  Depression         This is a new problem.  The current episode started more than 1 year ago.   The onset quality is gradual.   The problem occurs constantly.The problem is unchanged.  Associated symptoms include fatigue, irritable, decreased interest and sad.  Associated symptoms include no helplessness, no hopelessness, no restlessness, no myalgias and no suicidal ideas.     The symptoms are aggravated by family issues.  Past treatments include nothing.     Review of Systems  Constitutional: Positive for fatigue.  Musculoskeletal: Negative for myalgias.  Psychiatric/Behavioral: Positive for depression. Negative for suicidal ideas.  All other systems reviewed and are negative.  Family History  Problem Relation Age of Onset  . COPD Mother   . Arthritis Mother   . Arthritis Father   . Hyperlipidemia Father   . Diabetes Father    Social History   Social History  . Marital status: Single    Spouse name: N/A  . Number of children: N/A  . Years of education: N/A   Social History Main Topics  . Smoking status: Current Every Day Smoker    Types: Cigarettes  . Smokeless tobacco: Never Used  . Alcohol use No  . Drug use: No  . Sexual activity: Yes   Other Topics Concern  . None   Social History Narrative  . None       Objective:   Physical Exam  Constitutional: She is oriented to person, place, and time. She appears well-developed and well-nourished. She is irritable. No distress.  HENT:  Head: Normocephalic and atraumatic.  Right Ear: External ear normal.  Left Ear: External ear normal.  Nose: Nose normal.  Mouth/Throat: Oropharynx is clear and moist.  Eyes: Pupils are equal, round, and reactive to light.    Neck: Normal range of motion. Neck supple. No thyromegaly present.  Cardiovascular: Normal rate, regular rhythm, normal heart sounds and intact distal pulses.   No murmur heard. Pulmonary/Chest: Effort normal and breath sounds normal. No respiratory distress. She has no wheezes.  Abdominal: Soft. Bowel sounds are normal. She exhibits no distension. There is no tenderness.  Musculoskeletal: Normal range of motion. She exhibits no edema or tenderness.  Neurological: She is alert and oriented to person, place, and time.  Skin: Skin is warm and dry.  Psychiatric: She has a normal mood and affect. Her behavior is normal. Judgment and thought content normal.  Vitals reviewed.   BP 126/75   Pulse 67   Temp 98.7 F (37.1 C) (Oral)   Ht '5\' 9"'  (1.753 m)   Wt 266 lb 6.4 oz (120.8 kg)   LMP 08/23/2016 (Exact Date)   BMI 39.34 kg/m      Assessment & Plan:  1. Moderate episode of recurrent major depressive disorder ([place]) PT started on Lexapro 10 mg today Stress management discussed  - CMP14+EGFR - escitalopram (LEXAPRO) 10 MG tablet; Take 1 tablet (10 mg total) by mouth daily.  Dispense: 90 tablet; Refill: 3  2. Congenital absence of one kidney - CMP14+EGFR  3. Obesity (BMI 30-39.9) - CMP14+EGFR  4. Current smoker Smoking cessation discussed  - CMP14+EGFR  5. Fatigue, unspecified type - CMP14+EGFR -  Anemia Profile B - TSH - VITAMIN D 25 Hydroxy (Vit-D Deficiency, Fractures)   Continue all meds Labs pending Health Maintenance reviewed Diet and exercise encouraged RTO 6 weeks to recheck Depression   Evelina Dun, FNP

## 2016-09-25 LAB — ANEMIA PROFILE B
BASOS: 0 %
Basophils Absolute: 0 10*3/uL (ref 0.0–0.2)
EOS (ABSOLUTE): 0.5 10*3/uL — ABNORMAL HIGH (ref 0.0–0.4)
EOS: 4 %
FOLATE: 4.6 ng/mL (ref >3.0)
Ferritin: 38 ng/mL (ref 15–150)
HEMATOCRIT: 38.3 % (ref 34.0–46.6)
HEMOGLOBIN: 12.4 g/dL (ref 11.1–15.9)
IMMATURE GRANULOCYTES: 0 %
IRON: 33 ug/dL (ref 27–159)
Immature Grans (Abs): 0 10*3/uL (ref 0.0–0.1)
Iron Saturation: 10 % — ABNORMAL LOW (ref 15–55)
LYMPHS: 27 %
Lymphocytes Absolute: 3.5 10*3/uL — ABNORMAL HIGH (ref 0.7–3.1)
MCH: 27.5 pg (ref 26.6–33.0)
MCHC: 32.4 g/dL (ref 31.5–35.7)
MCV: 85 fL (ref 79–97)
Monocytes Absolute: 0.6 10*3/uL (ref 0.1–0.9)
Monocytes: 5 %
NEUTROS PCT: 64 %
Neutrophils Absolute: 8.3 10*3/uL — ABNORMAL HIGH (ref 1.4–7.0)
Platelets: 344 10*3/uL (ref 150–379)
RBC: 4.51 x10E6/uL (ref 3.77–5.28)
RDW: 14.1 % (ref 12.3–15.4)
RETIC CT PCT: 1.2 % (ref 0.6–2.6)
TIBC: 346 ug/dL (ref 250–450)
UIBC: 313 ug/dL (ref 131–425)
VITAMIN B 12: 238 pg/mL (ref 232–1245)
WBC: 13 10*3/uL — ABNORMAL HIGH (ref 3.4–10.8)

## 2016-09-25 LAB — CMP14+EGFR
A/G RATIO: 1.6 (ref 1.2–2.2)
ALK PHOS: 74 IU/L (ref 39–117)
ALT: 17 IU/L (ref 0–32)
AST: 15 IU/L (ref 0–40)
Albumin: 4.1 g/dL (ref 3.5–5.5)
BUN/Creatinine Ratio: 11 (ref 9–23)
BUN: 10 mg/dL (ref 6–20)
Bilirubin Total: <0.2 mg/dL (ref 0.0–1.2)
CALCIUM: 9.3 mg/dL (ref 8.7–10.2)
CO2: 20 mmol/L (ref 20–29)
CREATININE: 0.88 mg/dL (ref 0.57–1.00)
Chloride: 105 mmol/L (ref 96–106)
GFR calc Af Amer: 98 mL/min/{1.73_m2} (ref >59)
GFR, EST NON AFRICAN AMERICAN: 85 mL/min/{1.73_m2} (ref >59)
GLOBULIN, TOTAL: 2.6 g/dL (ref 1.5–4.5)
Glucose: 66 mg/dL (ref 65–99)
POTASSIUM: 4.6 mmol/L (ref 3.5–5.2)
SODIUM: 138 mmol/L (ref 134–144)
Total Protein: 6.7 g/dL (ref 6.0–8.5)

## 2016-09-25 LAB — TSH: TSH: 1.74 u[IU]/mL (ref 0.450–4.500)

## 2016-09-25 LAB — VITAMIN D 25 HYDROXY (VIT D DEFICIENCY, FRACTURES): VIT D 25 HYDROXY: 20.5 ng/mL — AB (ref 30.0–100.0)

## 2016-09-26 ENCOUNTER — Other Ambulatory Visit: Payer: Self-pay | Admitting: *Deleted

## 2016-09-26 MED ORDER — VITAMIN D (ERGOCALCIFEROL) 1.25 MG (50000 UNIT) PO CAPS: 50000.0000 [IU] | ORAL_CAPSULE | ORAL | 1 refills | Status: AC

## 2016-11-05 ENCOUNTER — Ambulatory Visit (INDEPENDENT_AMBULATORY_CARE_PROVIDER_SITE_OTHER): Payer: Medicaid Other | Admitting: Family

## 2016-11-05 ENCOUNTER — Encounter: Payer: Self-pay | Admitting: Family

## 2016-11-05 VITALS — BP 121/80 | HR 56 | Temp 97.8°F | Ht 69.0 in | Wt 264.4 lb

## 2016-11-05 DIAGNOSIS — F331 Major depressive disorder, recurrent, moderate: Secondary | ICD-10-CM

## 2016-11-05 DIAGNOSIS — K219 Gastro-esophageal reflux disease without esophagitis: Secondary | ICD-10-CM

## 2016-11-05 DIAGNOSIS — D509 Iron deficiency anemia, unspecified: Secondary | ICD-10-CM | POA: Diagnosis not present

## 2016-11-05 DIAGNOSIS — E669 Obesity, unspecified: Secondary | ICD-10-CM | POA: Diagnosis not present

## 2016-11-05 DIAGNOSIS — F17200 Nicotine dependence, unspecified, uncomplicated: Secondary | ICD-10-CM | POA: Diagnosis not present

## 2016-11-05 MED ORDER — ESCITALOPRAM OXALATE 20 MG PO TABS: 20.0000 mg | ORAL_TABLET | Freq: Every day | ORAL | 5 refills | Status: AC

## 2016-11-05 MED ORDER — OMEPRAZOLE 20 MG PO CPDR: 20.0000 mg | DELAYED_RELEASE_CAPSULE | Freq: Every day | ORAL | 3 refills | Status: DC

## 2016-11-05 NOTE — Progress Notes (Signed)
Subjective:    Patient ID: Jeanne Wilcox, female    DOB: [birth year], 36 y.o.   MRN: [id number]  Pt presents to the office today to recheck depression. Pt states she feels like it has helped, but continues to have decreased interest in things. PT states her husband has said she is "nicer now".  Depression         This is a chronic problem.  The current episode started more than 1 year ago.   The onset quality is gradual.   The problem occurs intermittently.  The problem has been waxing and waning since onset.  Associated symptoms include irritable, restlessness, decreased interest and sad.  Associated symptoms include no helplessness and no hopelessness.  Past treatments include SSRIs - Selective serotonin reuptake inhibitors.  Previous treatment provided mild relief. Anemia  Presents for follow-up visit. Symptoms include malaise/fatigue. There has been no bruising/bleeding easily.  Gastroesophageal Reflux  She complains of belching and heartburn. This is a new problem. The current episode started more than 1 month ago. The problem occurs constantly. The problem has been waxing and waning. The heartburn is of mild intensity. The symptoms are aggravated by certain foods and smoking. Risk factors include obesity. She has tried an antacid for the symptoms. The treatment provided mild relief.      Review of Systems  Constitutional: Positive for malaise/fatigue.  Gastrointestinal: Positive for heartburn.  Hematological: Does not bruise/bleed easily.  Psychiatric/Behavioral: Positive for depression.  All other systems reviewed and are negative.      Objective:   Physical Exam  Constitutional: She is oriented to person, place, and time. She appears well-developed and well-nourished. She is irritable. No distress.  HENT:  Head: Normocephalic and atraumatic.  Right Ear: External ear normal.  Left Ear: External ear normal.  Nose: Nose normal.  Mouth/Throat: Oropharynx is clear and moist.    Eyes: Pupils are equal, round, and reactive to light.  Neck: Normal range of motion. Neck supple. No thyromegaly present.  Cardiovascular: Normal rate, regular rhythm, normal heart sounds and intact distal pulses.   No murmur heard. Pulmonary/Chest: Effort normal and breath sounds normal. No respiratory distress. She has no wheezes.  Abdominal: Soft. Bowel sounds are normal. She exhibits no distension. There is no tenderness.  Musculoskeletal: Normal range of motion. She exhibits no edema or tenderness.  Neurological: She is alert and oriented to person, place, and time.  Skin: Skin is warm and dry.  Psychiatric: She has a normal mood and affect. Her behavior is normal. Judgment and thought content normal.  Vitals reviewed.     BP 121/80   Pulse (!) 56   Temp 97.8 F (36.6 C) (Oral)   Ht  (1.753 m)   Wt 264 lb 6.4 oz (119.9 kg)   BMI 39.05 kg/m      Assessment & Plan:  1. Moderate episode of recurrent major depressive disorder ([facility]) Lexapro increased to 20 mg today from 10 mg Stress management discussed - escitalopram (LEXAPRO) 20 MG tablet; Take 1 tablet (20 mg total) by mouth daily.  Dispense: 30 tablet; Refill: 5  2. Obesity (BMI 30-39.9)  3. Current smoker Smoking cessation discussed  4. Iron deficiency anemia, unspecified iron deficiency anemia type PT to start iron OTC, discussed lab results and the importance of starting this Take with stool softener and on empty stomach  5. Gastroesophageal reflux disease, esophagitis presence not specified Pt started on Prilosec today -Diet discussed- Avoid fried, spicy, citrus foods, caffeine and  alcohol -Do not eat 2-3 hours before bedtime -Encouraged small frequent meals -Avoid NSAID's - omeprazole (PRILOSEC) 20 MG capsule; Take 1 capsule (20 mg total) by mouth daily.  Dispense: 30 capsule; Refill: 3   RTO in 6 weeks to recheck Depression   Jannifer Rodneyhristy Cynthie Garmon, FNP

## 2016-11-05 NOTE — Patient Instructions (Signed)
Iron Deficiency Anemia, Adult Iron deficiency anemia is a condition in which the concentration of red blood cells or hemoglobin in the blood is below normal because of too little iron. Hemoglobin is a substance in red blood cells that carries oxygen to the body's tissues. When the concentration of red blood cells or hemoglobin is too low, not enough oxygen reaches these tissues. Iron deficiency anemia is usually long-lasting (chronic) and it develops over time. It may or may not cause symptoms. It is a common type of anemia. What are the causes? This condition may be caused by:  Not enough iron in the diet.  Blood loss caused by bleeding in the intestine.  Blood loss from a gastrointestinal condition like Crohn disease.  Frequent blood draws, such as from blood donation.  Abnormal absorption in the gut.  Heavy menstrual periods in women.  Cancers of the gastrointestinal system, such as colon cancer.  What are the signs or symptoms? Symptoms of this condition may include:  Fatigue.  Headache.  Pale skin, lips, and nail beds.  Poor appetite.  Weakness.  Shortness of breath.  Dizziness.  Cold hands and feet.  Fast or irregular heartbeat.  Irritability. This is more common in severe anemia.  Rapid breathing. This is more common in severe anemia.  Mild anemia may not cause any symptoms. How is this diagnosed? This condition is diagnosed based on:  Your medical history.  A physical exam.  Blood tests.  You may have additional tests to find the underlying cause of your anemia, such as:  Testing for blood in the stool (fecal occult blood test).  A procedure to see inside your colon and rectum (colonoscopy).  A procedure to see inside your esophagus and stomach (endoscopy).  A test in which cells are removed from bone marrow (bone marrow aspiration) or fluid is removed from the bone marrow to be examined (biopsy). This is rarely needed.  How is this  treated? This condition is treated by correcting the cause of your iron deficiency. Treatment may involve:  Adding iron-rich foods to your diet.  Taking iron supplements. If you are pregnant or breastfeeding, you may need to take extra iron because your normal diet usually does not provide the amount of iron that you need.  Increasing vitamin C intake. Vitamin C helps your body absorb iron. Your health care provider may recommend that you take iron supplements along with a glass of orange juice or a vitamin C supplement.  Medicines to make heavy menstrual flow lighter.  Surgery.  You may need repeat blood tests to determine whether treatment is working. Depending on the underlying cause, the anemia should be corrected within 2 months of starting treatment. If the treatment does not seem to be working, you may need more testing. Follow these instructions at home: Medicines  Take over-the-counter and prescription medicines only as told by your health care provider. This includes iron supplements and vitamins.  If you cannot tolerate taking iron supplements by mouth, talk with your health care provider about taking them through a vein (intravenously) or an injection into a muscle.  For the best iron absorption, you should take iron supplements when your stomach is empty. If you cannot tolerate them on an empty stomach, you may need to take them with food.  Do not drink milk or take antacids at the same time as your iron supplements. Milk and antacids may interfere with iron absorption.  Iron supplements can cause constipation. To prevent constipation, include fiber   in your diet as told by your health care provider. A stool softener may also be recommended. Eating and drinking  Talk with your health care provider before changing your diet. He or she may recommend that you eat foods that contain a lot of iron, such as: ? Liver. ? Low-fat (lean) beef. ? Breads and cereals that have iron  added to them (are fortified). ? Eggs. ? Dried fruit. ? Dark green, leafy vegetables.  To help your body use the iron from iron-rich foods, eat those foods at the same time as fresh fruits and vegetables that are high in vitamin C. Foods that are high in vitamin C include: ? Oranges. ? Peppers. ? Tomatoes. ? Mangoes.  Drinkenoughfluid to keep your urine clear or pale yellow. General instructions  Return to your normal activities as told by your health care provider. Ask your health care provider what activities are safe for you.  Practice good hygiene. Anemia can make you more prone to illness and infection.  Keep all follow-up visits as told by your health care provider. This is important. Contact a health care provider if:  You feel nauseous or you vomit.  You feel weak.  You have unexplained sweating.  You develop symptoms of constipation, such as: ? Having fewer than three bowel movements a week. ? Straining to have a bowel movement. ? Having stools that are hard, dry, or larger than normal. ? Feeling full or bloated. ? Pain in the lower abdomen. ? Not feeling relief after having a bowel movement. Get help right away if:  You faint. If this happens, do not drive yourself to the hospital. Call your local emergency services (911 in the [place]  You have chest pain.  You have shortness of breath that: ? Is severe. ? Gets worse with physical activity.  You have a rapid heartbeat.  You become light-headed when getting up from a sitting or lying down position. This information is not intended to replace advice given to you by your health care provider. Make sure you discuss any questions you have with your health care provider. Document Released: 02/09/2000 Document Revised: 11/01/2015 Document Reviewed: 11/01/2015 Elsevier Interactive Patient Education  2018 [employer]  

## 2016-12-17 ENCOUNTER — Ambulatory Visit: Payer: Medicaid Other | Admitting: Family

## 2017-04-13 ENCOUNTER — Other Ambulatory Visit: Payer: Self-pay | Admitting: Family

## 2017-04-13 DIAGNOSIS — K219 Gastro-esophageal reflux disease without esophagitis: Secondary | ICD-10-CM

## 2018-09-08 ENCOUNTER — Encounter: Payer: Self-pay | Admitting: Family

## 2018-09-08 ENCOUNTER — Ambulatory Visit: Payer: Medicaid Other | Admitting: Family

## 2018-09-08 DIAGNOSIS — R109 Unspecified abdominal pain: Secondary | ICD-10-CM | POA: Diagnosis not present

## 2018-09-08 DIAGNOSIS — R399 Unspecified symptoms and signs involving the genitourinary system: Secondary | ICD-10-CM | POA: Diagnosis not present

## 2018-09-08 DIAGNOSIS — N12 Tubulo-interstitial nephritis, not specified as acute or chronic: Secondary | ICD-10-CM

## 2018-09-08 LAB — URINALYSIS, COMPLETE
Bilirubin, UA: NEGATIVE
Glucose, UA: NEGATIVE
Ketones, UA: NEGATIVE
Nitrite, UA: POSITIVE — AB
Specific Gravity, UA: 1.015 (ref 1.005–1.030)
Urobilinogen, Ur: 1 mg/dL (ref 0.2–1.0)
pH, UA: 6 (ref 5.0–7.5)

## 2018-09-08 LAB — MICROSCOPIC EXAMINATION
Renal Epithel, UA: NONE SEEN /hpf
WBC, UA: >30 /hpf — AB (ref 0–5)

## 2018-09-08 MED ORDER — CIPROFLOXACIN HCL 500 MG PO TABS: 500.0000 mg | ORAL_TABLET | Freq: Two times a day (BID) | ORAL | 0 refills | Status: DC

## 2018-09-08 MED ORDER — CEFTRIAXONE SODIUM 1 G IJ SOLR
1.0000 g | Freq: Once | INTRAMUSCULAR | Status: AC
  Administered 2018-09-08: 1 g via INTRAMUSCULAR

## 2018-09-08 NOTE — Progress Notes (Signed)
   Virtual Visit via telephone Note  I connected with Jeanne Wilcox on 09/08/18 at 8:50 AM by telephone and verified that I am speaking with the correct person using two identifiers. Jeanne Wilcox is currently located at home and no one is currently with her during visit. The provider, Evelina Dun, FNP is located in their office at time of visit.  I discussed the limitations, risks, security and privacy concerns of performing an evaluation and management service by telephone and the availability of in person appointments. I also discussed with the patient that there may be a patient responsible charge related to this service. The patient expressed understanding and agreed to proceed.   History and Present Illness:  Dysuria  This is a new problem. The current episode started in the past 7 days. The problem occurs every urination. The problem has been gradually worsening. The quality of the pain is described as burning. The pain is at a severity of 7/10. The maximum temperature recorded prior to her arrival was 101 - 101.9 F. Associated symptoms include flank pain, frequency, nausea and urgency. Pertinent negatives include no hematuria. She has tried increased fluids, NSAIDs and acetaminophen for the symptoms. The treatment provided mild relief.      Review of Systems  Gastrointestinal: Positive for nausea.  Genitourinary: Positive for dysuria, flank pain, frequency and urgency. Negative for hematuria.  All other systems reviewed and are negative.    Observations/Objective: No SOB or distress   Assessment and Plan: 1. UTI symptoms Force fluids RTO if symptoms worsen or do not improve over the next 24-48 hours Culture pending - ciprofloxacin (CIPRO) 500 MG tablet; Take 1 tablet (500 mg total) by mouth 2 (two) times daily.  Dispense: 14 tablet; Refill: 0 - Urinalysis, Complete - Urine Culture - cefTRIAXone (ROCEPHIN) injection 1 g  2. Flank pain - Urinalysis, Complete - Urine  Culture - cefTRIAXone (ROCEPHIN) injection 1 g  3. Pyelonephritis Culture pending    I discussed the assessment and treatment plan with the patient. The patient was provided an opportunity to ask questions and all were answered. The patient agreed with the plan and demonstrated an understanding of the instructions.   The patient was advised to call back or seek an in-person evaluation if the symptoms worsen or if the condition fails to improve as anticipated.  The above assessment and management plan was discussed with the patient. The patient verbalized understanding of and has agreed to the management plan. Patient is aware to call the clinic if symptoms persist or worsen. Patient is aware when to return to the clinic for a follow-up visit. Patient educated on when it is appropriate to go to the emergency department.   Time call ended:  9:03 AM  I provided 13 minutes of non-face-to-face time during this encounter.    Evelina Dun, FNP

## 2018-09-09 ENCOUNTER — Telehealth: Payer: Self-pay | Admitting: Family

## 2018-09-09 NOTE — Telephone Encounter (Signed)
Note send to her MyChart

## 2018-09-10 ENCOUNTER — Other Ambulatory Visit: Payer: Self-pay | Admitting: Family

## 2018-09-10 LAB — URINE CULTURE

## 2018-09-10 MED ORDER — SULFAMETHOXAZOLE-TRIMETHOPRIM 800-160 MG PO TABS: 1.0000 | ORAL_TABLET | Freq: Two times a day (BID) | ORAL | 0 refills | Status: DC

## 2018-09-10 NOTE — Telephone Encounter (Signed)
Aware.  Message left on her voice mail.

## 2021-06-29 ENCOUNTER — Encounter: Payer: Self-pay | Admitting: Family Medicine

## 2021-06-29 ENCOUNTER — Ambulatory Visit (INDEPENDENT_AMBULATORY_CARE_PROVIDER_SITE_OTHER): Payer: Medicaid Other | Admitting: Family Medicine

## 2021-06-29 DIAGNOSIS — J0110 Acute frontal sinusitis, unspecified: Secondary | ICD-10-CM | POA: Diagnosis not present

## 2021-06-29 MED ORDER — AMOXICILLIN-POT CLAVULANATE 875-125 MG PO TABS: 1.0000 | ORAL_TABLET | Freq: Two times a day (BID) | ORAL | 0 refills | Status: AC

## 2021-06-29 NOTE — Progress Notes (Signed)
? ?Virtual Visit via telephone Note ? ?I connected with Jeanne Wilcox on 06/29/21 at 1650 by telephone and verified that I am speaking with the correct person using two identifiers. Jeanne Wilcox is currently located at home and patient are currently with her during visit. The provider, Elige Radon Glessie Eustice, MD is located in their office at time of visit. ? ?Call ended at 1655 ? ?I discussed the limitations, risks, security and privacy concerns of performing an evaluation and management service by telephone and the availability of in person appointments. I also discussed with the patient that there may be a patient responsible charge related to this service. The patient expressed understanding and agreed to proceed. ? ? ?History and Present Illness: ?Patient is calling in for 2 days of sore throat and congestion.  She was mowing the lawn and having a lot of coughing and congestion and ear pressure.  She denies fevers or chills or SOB or wheezing.  She is using tylenol and ibuprofen and lozenges and they help a little but not much. She had some chest congestion yesterday.  ? ?1. Acute non-recurrent frontal sinusitis   ? ? ?Outpatient Encounter Medications as of 06/29/2021  ?Medication Sig  ? amoxicillin-clavulanate (AUGMENTIN) 875-125 MG tablet Take 1 tablet by mouth 2 (two) times daily.  ? escitalopram (LEXAPRO) 20 MG tablet Take 1 tablet (20 mg total) by mouth daily.  ? omeprazole (PRILOSEC) 20 MG capsule TAKE 1 CAPSULE BY MOUTH EVERY DAY  ? Vitamin D, Ergocalciferol, (DRISDOL) 50000 units CAPS capsule Take 1 capsule (50,000 Units total) by mouth every 7 (seven) days.  ? [DISCONTINUED] ciprofloxacin (CIPRO) 500 MG tablet Take 1 tablet (500 mg total) by mouth 2 (two) times daily.  ? [DISCONTINUED] sulfamethoxazole-trimethoprim (BACTRIM DS) 800-160 MG tablet Take 1 tablet by mouth 2 (two) times daily.  ? ?No facility-administered encounter medications on file as of 06/29/2021.  ? ? ?Review of Systems  ?Constitutional:   Negative for chills and fever.  ?HENT:  Positive for congestion, postnasal drip, rhinorrhea, sinus pressure, sneezing and sore throat. Negative for ear discharge and ear pain.   ?Eyes:  Negative for pain, redness and visual disturbance.  ?Respiratory:  Positive for cough. Negative for chest tightness and shortness of breath.   ?Cardiovascular:  Negative for chest pain and leg swelling.  ?Genitourinary:  Negative for difficulty urinating and dysuria.  ?Musculoskeletal:  Negative for back pain and gait problem.  ?Skin:  Negative for rash.  ?Neurological:  Negative for light-headedness and headaches.  ?Psychiatric/Behavioral:  Negative for agitation and behavioral problems.   ?All other systems reviewed and are negative. ? ?Observations/Objective: ?Patient sounds comfortable and in no acute distress ? ?Assessment and Plan: ?Problem List Items Addressed This Visit   ?None ?Visit Diagnoses   ? ? Acute non-recurrent frontal sinusitis    -  Primary  ? Relevant Medications  ? amoxicillin-clavulanate (AUGMENTIN) 875-125 MG tablet  ? ?  ?  ?Will treat like sinus infection and give amoxicillin and recommended Flonase and Mucinex and ibuprofen as well to help with the pressure. ?Follow up plan: ?Return if symptoms worsen or fail to improve. ? ? ?  ?I discussed the assessment and treatment plan with the patient. The patient was provided an opportunity to ask questions and all were answered. The patient agreed with the plan and demonstrated an understanding of the instructions. ?  ?The patient was advised to call back or seek an in-person evaluation if the symptoms worsen or if the condition  fails to improve as anticipated. ? ?The above assessment and management plan was discussed with the patient. The patient verbalized understanding of and has agreed to the management plan. Patient is aware to call the clinic if symptoms persist or worsen. Patient is aware when to return to the clinic for a follow-up visit. Patient educated on  when it is appropriate to go to the emergency department.  ? ? ?I provided 5 minutes of non-face-to-face time during this encounter. ? ? ? ?Nils Pyle, MD ?  ? ?

## 2022-07-30 ENCOUNTER — Telehealth: Payer: Self-pay

## 2022-07-30 NOTE — Transitions of Care (Post Inpatient/ED Visit) (Signed)
   07/30/2022  Name: Jeanne Wilcox MRN: [id number] DOB: 06-18-1980  Today's TOC FU Call Status: Today's TOC FU Call Status:: Unsuccessul Call (1st Attempt) Unsuccessful Call (1st Attempt) Date: 07/30/22  Attempted to reach the patient regarding the most recent Inpatient/ED visit.  Follow Up Plan: Additional outreach attempts will be made to reach the patient to complete the Transitions of Care (Post Inpatient/ED visit) call.   Signature Karena Addison, LPN [facility] Nurse Health Advisor Direct Dial [phone]

## 2022-07-31 NOTE — Transitions of Care (Post Inpatient/ED Visit) (Signed)
   07/31/2022  Name: Jeanne Wilcox MRN: [id number] DOB: Jan 02, 1981  Today's TOC FU Call Status: Today's TOC FU Call Status:: Unsuccessful Call (2nd Attempt) Unsuccessful Call (1st Attempt) Date: 07/30/22 Unsuccessful Call (2nd Attempt) Date: 07/31/22  Attempted to reach the patient regarding the most recent Inpatient/ED visit.  Follow Up Plan: Additional outreach attempts will be made to reach the patient to complete the Transitions of Care (Post Inpatient/ED visit) call.   Signature Karena Addison, LPN [facility] Nurse Health Advisor Direct Dial [phone]

## 2022-08-02 NOTE — Transitions of Care (Post Inpatient/ED Visit) (Signed)
   08/02/2022  Name: Jeanne Wilcox MRN: [id number] DOB: Nov 06, 1980  Today's TOC FU Call Status: Today's TOC FU Call Status:: Unsuccessful Call (3rd Attempt) Unsuccessful Call (1st Attempt) Date: 07/30/22 Unsuccessful Call (2nd Attempt) Date: 07/31/22 Unsuccessful Call (3rd Attempt) Date: 08/02/22  Attempted to reach the patient regarding the most recent Inpatient/ED visit.  Follow Up Plan: No further outreach attempts will be made at this time. We have been unable to contact the patient.  Signature Karena Addison, LPN [facility] Nurse Health Advisor Direct Dial [phone]
